# Patient Record
Sex: Female | Born: 1985 | Race: White | Hispanic: No | Marital: Single | Smoking: Former smoker
Health system: Southern US, Community
[De-identification: ages and names within clinical notes are randomized; demographics above are authoritative.]

## PROBLEM LIST (undated history)

## (undated) DIAGNOSIS — J189 Pneumonia, unspecified organism: Secondary | ICD-10-CM

## (undated) DIAGNOSIS — E119 Type 2 diabetes mellitus without complications: Secondary | ICD-10-CM

## (undated) DIAGNOSIS — F419 Anxiety disorder, unspecified: Secondary | ICD-10-CM

## (undated) DIAGNOSIS — F32A Depression, unspecified: Secondary | ICD-10-CM

## (undated) DIAGNOSIS — F329 Major depressive disorder, single episode, unspecified: Secondary | ICD-10-CM

## (undated) HISTORY — PX: TYMPANOSTOMY TUBE PLACEMENT: SHX32

## (undated) HISTORY — DX: Type 2 diabetes mellitus without complications: E11.9

## (undated) HISTORY — PX: RETINAL LASER PROCEDURE: SHX2339

## (undated) HISTORY — PX: MULTIPLE TOOTH EXTRACTIONS: SHX2053

---
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
## 2007-09-08 ENCOUNTER — Encounter: Payer: Self-pay | Admitting: Infectious Diseases

## 2007-12-15 ENCOUNTER — Ambulatory Visit: Payer: Self-pay | Admitting: Infectious Diseases

## 2007-12-15 DIAGNOSIS — T678XXA Other effects of heat and light, initial encounter: Secondary | ICD-10-CM | POA: Insufficient documentation

## 2007-12-15 DIAGNOSIS — R300 Dysuria: Secondary | ICD-10-CM | POA: Insufficient documentation

## 2007-12-15 LAB — CONVERTED CEMR LAB
ALT: 11 U/L
AST: 10 U/L
Albumin: 3.8 g/dL
Alkaline Phosphatase: 66 U/L
BUN: 16 mg/dL
Basophils Absolute: 0 10*3/uL
Basophils Relative: 0 %
Bilirubin Urine: NEGATIVE
CO2: 24 meq/L
Calcium: 9.8 mg/dL
Chlamydia, Swab/Urine, PCR: NEGATIVE
Chloride: 100 meq/L
Creatinine, Ser: 1.11 mg/dL
Eosinophils Absolute: 0.2 10*3/uL
Eosinophils Relative: 2 %
GC Probe Amp, Urine: NEGATIVE
Glucose, Bld: 300 mg/dL — ABNORMAL HIGH
HCT: 42.8 %
Hemoglobin, Urine: NEGATIVE
Hemoglobin: 14.9 g/dL
Ketones, ur: NEGATIVE mg/dL
Leukocytes, UA: NEGATIVE
Lymphocytes Relative: 34 %
Lymphs Abs: 3.1 10*3/uL
MCHC: 34.8 g/dL
MCV: 83.1 fL
Monocytes Absolute: 0.6 10*3/uL
Monocytes Relative: 6 %
Neutro Abs: 5.4 10*3/uL
Neutrophils Relative %: 58 %
Nitrite: POSITIVE — AB
Platelets: 328 10*3/uL
Potassium: 4.5 meq/L
Protein, ur: NEGATIVE mg/dL
RBC: 5.15 M/uL — ABNORMAL HIGH
RDW: 12.7 %
Sed Rate: 25 mm/h — ABNORMAL HIGH
Sodium: 137 meq/L
Specific Gravity, Urine: 1.031 — ABNORMAL HIGH
TSH: 1.4 u[IU]/mL
Total Bilirubin: 0.2 mg/dL — ABNORMAL LOW
Total Protein: 7.1 g/dL
Urine Glucose: >1000 mg/dL — AB
Urobilinogen, UA: 0.2
WBC: 9.4 10*3/uL
pH: 5.5

## 2009-07-08 ENCOUNTER — Emergency Department (HOSPITAL_COMMUNITY): Admission: EM | Admit: 2009-07-08 | Discharge: 2009-07-08 | Payer: Self-pay | Admitting: Emergency Medicine

## 2010-11-29 NOTE — Op Note (Signed)
[place]  Patient:    Kristen Carrillo, Kristen Carrillo                     MRN: [id number] Proc. Date: 09/04/99 Adm. Date:  [id number] Disc. Date: [id number] Attending:  Retia Passe                           Operative Report  PREOPERATIVE DIAGNOSES: 1. Malformed and abscessed teeth #2, #15, #18, and #31. 2. History of insulin-dependent diabetes mellitus.  POSTOPERATIVE DIAGNOSES: 1. Malformed and abscessed teeth #2, #15, #18, and #31. 2. History of insulin-dependent diabetes mellitus.  SURGERY PERFORMED:  Removal of the above teeth.  SURGEON:  Vania Rea. Warren Danes, D.D.S.  ANESTHESIA:  General via oral endotracheal intubation.  ESTIMATED BLOOD LOSS:  Less than 50 cc.  FLUID REPLACEMENT:  Approximately 1000 cc crystalloid solutions.  COMPLICATIONS:  None apparent.  INDICATIONS FOR PROCEDURE:  [name] is a 25 year old female who was referred o my office for removal of the above teeth.  The patient has an abscessed tooth #31, and teeth #2, #15, and #18, are severely malformed and hypoplastic.  Due to her  history of insulin-dependent diabetes the procedure was scheduled under operating room conditions where her insulin and glucose could be monitored closely.  PROCEDURE IN DETAIL:  On September 04, 1999, [name] was taken to [facility] where she was placed on the operating room table in a supine position.  Following successful oral endotracheal intubation and general anesthesia, the patients face, neck, and oral cavity were prepped in usual sterile operating room fashion.  A 2 inch vaginal pack was placed as a throat pack.  Attention was then directed intraorally, where approximately 10 cc of 0.50% Xylocaine containing 1:200,000 epinephrine were infiltrated in the right left posterior alveolar nerve distributions of the maxilla and the right and left inferior alveolar neurovascular regions.  Attention was then  directed towards the maxillary arch, where a full thickness mucoperiosteal flap was elevated around he buccal and lingual surfaces of teeth #2, and #15.  The teeth were subluxated from the alveolus using an 11A elevator, removed from the oral cavity using a 150 dental forceps.  The areas were smoothed with a small osseous file and thoroughly irrigated and suctioned.  The mucoperiosteal margins were approximated and sutured in an interrupted fashion using 4-0 chromic suture material on FS3 needle.  Next, teeth #18, and #31 were removed with tooth #31 having a large periapical abscess that was removed as well.  The margins were smoothed and contoured with the osseous file and thoroughly irrigated and suctioned.  Mucoperiosteal margins were approximated and sutured following placement of Gelfoam/Maxitrol packs in the lower extraction site.  The patient was allowed to awaken from the anesthesia, and was taken to the recovery room following removal of the throat pack, which she tolerated the procedure well and without apparent complication. DD:  09/04/99 TD:  09/04/99 Job: [id number]

## 2010-12-13 LAB — HM DIABETES EYE EXAM

## 2011-05-16 ENCOUNTER — Emergency Department (HOSPITAL_COMMUNITY)
Admission: EM | Admit: 2011-05-16 | Discharge: 2011-05-16 | Disposition: A | Discharge status: Home / Self Care | Payer: BC Managed Care – PPO | Attending: Emergency Medicine | Admitting: Emergency Medicine

## 2011-05-16 DIAGNOSIS — R112 Nausea with vomiting, unspecified: Secondary | ICD-10-CM | POA: Insufficient documentation

## 2011-05-16 DIAGNOSIS — E109 Type 1 diabetes mellitus without complications: Secondary | ICD-10-CM | POA: Insufficient documentation

## 2011-05-16 DIAGNOSIS — Z794 Long term (current) use of insulin: Secondary | ICD-10-CM | POA: Insufficient documentation

## 2011-05-16 DIAGNOSIS — R197 Diarrhea, unspecified: Secondary | ICD-10-CM | POA: Insufficient documentation

## 2011-05-16 LAB — URINALYSIS, ROUTINE W REFLEX MICROSCOPIC
Bilirubin Urine: NEGATIVE
Glucose, UA: NEGATIVE mg/dL
Hgb urine dipstick: NEGATIVE
Ketones, ur: >80 mg/dL — AB
Leukocytes, UA: NEGATIVE
Nitrite: NEGATIVE
Protein, ur: NEGATIVE mg/dL
Specific Gravity, Urine: 1.016 (ref 1.005–1.030)
Urobilinogen, UA: 1 mg/dL (ref 0.0–1.0)
pH: 7 (ref 5.0–8.0)

## 2011-05-16 LAB — DIFFERENTIAL
Basophils Absolute: 0 10*3/uL (ref 0.0–0.1)
Basophils Relative: 0 % (ref 0–1)
Eosinophils Absolute: 0 10*3/uL (ref 0.0–0.7)
Eosinophils Relative: 0 % (ref 0–5)
Lymphocytes Relative: 15 % (ref 12–46)
Lymphs Abs: 2 10*3/uL (ref 0.7–4.0)
Monocytes Absolute: 0.5 10*3/uL (ref 0.1–1.0)
Monocytes Relative: 4 % (ref 3–12)
Neutro Abs: 10.4 10*3/uL — ABNORMAL HIGH (ref 1.7–7.7)
Neutrophils Relative %: 81 % — ABNORMAL HIGH (ref 43–77)

## 2011-05-16 LAB — BASIC METABOLIC PANEL
BUN: 11 mg/dL (ref 6–23)
CO2: 23 mEq/L (ref 19–32)
CO2: 20 mEq/L (ref 19–32)
Calcium: 8.5 mg/dL (ref 8.4–10.5)
Chloride: 96 mEq/L (ref 96–112)
Chloride: 104 mEq/L (ref 96–112)
Creatinine, Ser: 0.54 mg/dL (ref 0.50–1.10)
Glucose, Bld: 222 mg/dL — ABNORMAL HIGH (ref 70–99)
Glucose, Bld: 200 mg/dL — ABNORMAL HIGH (ref 70–99)
Potassium: 3.9 mEq/L (ref 3.5–5.1)
Potassium: 4.2 mEq/L (ref 3.5–5.1)
Sodium: 136 mEq/L (ref 135–145)

## 2011-05-16 LAB — GLUCOSE, CAPILLARY
Glucose-Capillary: 190 mg/dL — ABNORMAL HIGH (ref 70–99)
Glucose-Capillary: 160 mg/dL — ABNORMAL HIGH (ref 70–99)

## 2011-05-16 LAB — CBC
HCT: 40.6 % (ref 36.0–46.0)
Hemoglobin: 14.6 g/dL (ref 12.0–15.0)
MCV: 82.5 fL (ref 78.0–100.0)
RBC: 4.92 MIL/uL (ref 3.87–5.11)
RDW: 12.4 % (ref 11.5–15.5)
WBC: 12.9 10*3/uL — ABNORMAL HIGH (ref 4.0–10.5)

## 2013-01-10 LAB — HM DIABETES EYE EXAM

## 2014-08-08 ENCOUNTER — Ambulatory Visit (INDEPENDENT_AMBULATORY_CARE_PROVIDER_SITE_OTHER): Payer: Self-pay | Admitting: Internal Medicine

## 2014-08-08 ENCOUNTER — Encounter: Payer: Self-pay | Admitting: Internal Medicine

## 2014-08-08 VITALS — BP 112/64 | HR 114 | Temp 98.5°F | Resp 12 | Ht 67.0 in | Wt 158.0 lb

## 2014-08-08 DIAGNOSIS — E109 Type 1 diabetes mellitus without complications: Secondary | ICD-10-CM

## 2014-08-08 LAB — HEMOGLOBIN A1C: HEMOGLOBIN A1C: 6.4 % (ref 4.6–6.5)

## 2014-08-08 NOTE — Patient Instructions (Signed)
Please change the pump settings as shown in bold: - basal rates: 12 am: 1.6 units/h >> 1.3 2 am: 1.1 >> 0.9 5 am: 2.0 >> 1.9 8 am: 1.7 >> 1.5 3 pm: 1.9 >> 1.8 8 pm: 1.9 >> 1.8 - ICR: 8.5 - target: 100-100 >> 110-110 - ISF: 30  - Insulin on Board: 4h - bolus wizard: on  Try to eat 3 meals a day, whenever possible.  Start the boluses 15 minutes before you eat.  Please stop at the lab.  Please return in 1.5 month with your sugar log.   Basic Rules for Patients with Type I Diabetes Mellitus  1. The American Diabetes Association (ADA) recommended targets: - fasting sugar <130 - after meal sugar <180 - HbA1C <7%  2. Engage in ?150 min moderate exercise per week  3. Make sure you have ?8h of sleep every night as this helps both blood sugars and your weight.  4. Always keep a sugar log (not only record in your meter) and bring it to all appointments with [place].  5. If you are on a pump, know how to access the settings and to modify the parameters.  6.  Remember, you can always call the number on the back of the pump for emergencies related to the pump.  7. "15-15 rule" for hypoglycemia: if sugars are low, take 15 g of carbs** ("fast sugar" - e.g. 4 glucose tablets, 4 oz orange juice), wait 15 min, then check sugars again. If still <80, repeat. Continue  until your sugars >80, then eat a normal meal.   8. Teach family members and coworkers to inject glucagon. Have a glucagon set at home and one at work. They should call 911 after using the set.  9. If you are on a pump, set "insulin on board" time for 5 hours (if your sugars tend to be higher, can use 4 hours).   10. If you are on a pump, use the "dual wave bolus" setting for high fat foods (e.g. pizza). Start with a setting of 50%-50% (50% instant bolus and 50% prolonged bolus over 3h, for e.g.).    11. If you are on a pump, make sure the basal daily insulin dose is approximately equal (not larger) to the daily insulin you get  from boluses, otherwise you are at risk for hypoglycemia.  12. Check sugar before driving. If <100, correct, and only start driving if sugars rise ?161. Check sugar every hour when on a long drive.  13. Check sugar before exercising. If <100, correct, and only start exercising if sugars rise ?100. Check sugar every hour when on a long exercise routine and 1h after you finished exercising.   If >250, check urine for ketones. If you have moderate-large ketones in urine, do not start exercise. Hydrate yourself with clear liquids and correct the high sugar. Recheck sugars and ketones before attempting to exercise.  Be aware that you might need less insulin when exercising.  *intense, short, exercise bursts can increase your sugars, but  *less intense, longer (>1h), exercise routines can decrease your sugars.  If you are on a pump, you might need to decrease your basal rate by 10% or more (or even disconnect your pump) while you exercise to prevent low sugars. Do not disconnect your pump by more than 3 hours at a time! You also might need to decrease your insulin bolus for the meal prior to your exercise time by 20% or more.  14. Make sure  you have a MedAlert bracelet or pendant mentioning "Type I Diabetes Mellitus". If you have a prior episode of severe hypoglycemia or hypoglycemia unawareness, it should also mention this.  15. Please do not walk barefoot. Inspect your feet for sores/cuts and let us know if you have them.  16. Please call  Endocrinology with any questions and concerns [phone]).   **E.g. of "fast carbs": ? first choice (15 g):  1 tube glucose gel, GlucoPouch 15, 2 oz glucose liquid ? second choice (15-16 g):  3 or 4 glucose tablets (best taken  with water), 15 Dextrose Bits chewable ? third choice (15-20 g):   cup fruit juice,  cup regular soda, 1 cup skim milk,  1 cup sports drink ? fourth choice (15-20 g):  1 small tube [place] gel (not frosting), 2  tbsp raisins, 1 tbsp table sugar,  candy, jelly beans, gum drops - check package for carb amount   (adapted from: Juluis RainierMcCall A.L. "Insulin therapy and hypoglycemia" Endocrinol Metab Clin N Am 2012, 41: 57-87)
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
## 2014-09-19 ENCOUNTER — Encounter: Payer: Self-pay | Admitting: Internal Medicine

## 2014-09-19 ENCOUNTER — Ambulatory Visit (INDEPENDENT_AMBULATORY_CARE_PROVIDER_SITE_OTHER): Payer: BLUE CROSS/BLUE SHIELD | Admitting: Internal Medicine

## 2014-09-19 VITALS — BP 118/78 | HR 86 | Temp 98.3°F | Resp 12 | Wt 157.0 lb

## 2014-09-19 DIAGNOSIS — E10649 Type 1 diabetes mellitus with hypoglycemia without coma: Secondary | ICD-10-CM | POA: Insufficient documentation

## 2014-09-19 NOTE — Patient Instructions (Signed)
Please change the pump settings as follows: - basal rates: 12 am: 1.3 units/h >> 1.1 2 am: 0.9 >> 1.0 5 am: 1.9 >> 1.6 8 am: 1.5 3 pm: 1.8 >> 1.6 8 pm: 1.8 >> 1.6 - ICR: 8.5 - target: 110-110 - ISF: 30 >> 40 - Insulin on Board: 4h - bolus wizard: on  Please return in 3 months with your sugar log.
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
## 2014-10-11 LAB — HM PAP SMEAR

## 2014-11-29 LAB — HM DIABETES EYE EXAM

## 2014-12-22 ENCOUNTER — Ambulatory Visit (INDEPENDENT_AMBULATORY_CARE_PROVIDER_SITE_OTHER): Payer: BC Managed Care – PPO | Admitting: Internal Medicine

## 2014-12-22 VITALS — BP 112/64 | HR 108 | Temp 98.6°F | Resp 12 | Wt 162.0 lb

## 2014-12-22 DIAGNOSIS — E10649 Type 1 diabetes mellitus with hypoglycemia without coma: Secondary | ICD-10-CM

## 2014-12-22 LAB — COMPLETE METABOLIC PANEL WITH GFR
ALT: 13 U/L (ref 0–35)
AST: 15 U/L (ref 0–37)
Albumin: 3.4 g/dL — ABNORMAL LOW (ref 3.5–5.2)
Alkaline Phosphatase: 41 U/L (ref 39–117)
BILIRUBIN TOTAL: 0.4 mg/dL (ref 0.2–1.2)
BUN: 14 mg/dL (ref 6–23)
CALCIUM: 9.2 mg/dL (ref 8.4–10.5)
CO2: 23 meq/L (ref 19–32)
Chloride: 103 mEq/L (ref 96–112)
Creat: 0.77 mg/dL (ref 0.50–1.10)
GFR, Est African American: >89 mL/min
GFR, Est Non African American: >89 mL/min
Glucose, Bld: 108 mg/dL — ABNORMAL HIGH (ref 70–99)
Potassium: 4.4 mEq/L (ref 3.5–5.3)
SODIUM: 142 meq/L (ref 135–145)
TOTAL PROTEIN: 5.9 g/dL — AB (ref 6.0–8.3)

## 2014-12-22 LAB — LIPID PANEL
CHOL/HDL RATIO: 2
Cholesterol: 167 mg/dL (ref 0–200)
HDL: 70.8 mg/dL (ref >39.00)
LDL CALC: 84 mg/dL (ref 0–99)
NonHDL: 96.2
TRIGLYCERIDES: 59 mg/dL (ref 0.0–149.0)
VLDL: 11.8 mg/dL (ref 0.0–40.0)

## 2014-12-22 LAB — MICROALBUMIN / CREATININE URINE RATIO
Creatinine,U: 152 mg/dL
Microalb Creat Ratio: 0.5 mg/g (ref 0.0–30.0)
Microalb, Ur: <0.7 mg/dL (ref 0.0–1.9)

## 2014-12-22 LAB — TSH: TSH: 1.14 u[IU]/mL (ref 0.35–4.50)

## 2014-12-22 LAB — HEMOGLOBIN A1C: Hgb A1c MFr Bld: 6.6 % — ABNORMAL HIGH (ref 4.6–6.5)

## 2014-12-22 MED ORDER — NOVOLOG 100 UNIT/ML ~~LOC~~ SOLN: SUBCUTANEOUS | Status: DC
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
## 2014-12-22 NOTE — Patient Instructions (Signed)
Please change the pump settings as follows: - basal rates: 12 am: 1.1 units/h >> 1.3 2 am: 1.0 >> 1.3 5 am: 1.6 >> 1.75 8 am: 1.5 3 pm: 1.6 >> 1.5 8 pm: 1.6 - ICR: 8.5, except 5-8 pm: 7.5 - target: 110-110 - ISF: 40 - Insulin on Board: 4h - bolus wizard: on  Please return in 3 months.  Please stop at the lab.

## 2014-12-23 ENCOUNTER — Encounter: Payer: Self-pay | Admitting: Internal Medicine

## 2014-12-26 ENCOUNTER — Encounter: Payer: Self-pay | Admitting: *Deleted

## 2015-01-17 ENCOUNTER — Telehealth: Payer: Self-pay | Admitting: Nutrition

## 2015-01-17 NOTE — Telephone Encounter (Signed)
Message left on her machine that she did not need to fill the cartridge on her insulin pump full.  She was told how to look on her pump to know how much insulin she uses each day and then multiply that by 3, and then  to add 50 more units to that total.  This will give her enough insulin, in case she gets sick or eats more.  She was told to call me if she had questions on how to do this.  I gave her my number.

## 2015-03-26 ENCOUNTER — Ambulatory Visit: Payer: BLUE CROSS/BLUE SHIELD | Admitting: Internal Medicine

## 2015-04-13 ENCOUNTER — Ambulatory Visit: Payer: BC Managed Care – PPO | Admitting: Internal Medicine

## 2015-06-05 ENCOUNTER — Other Ambulatory Visit (INDEPENDENT_AMBULATORY_CARE_PROVIDER_SITE_OTHER): Payer: BC Managed Care – PPO | Admitting: *Deleted

## 2015-06-05 ENCOUNTER — Ambulatory Visit (INDEPENDENT_AMBULATORY_CARE_PROVIDER_SITE_OTHER): Payer: BC Managed Care – PPO | Admitting: Internal Medicine

## 2015-06-05 ENCOUNTER — Encounter: Payer: Self-pay | Admitting: Internal Medicine

## 2015-06-05 VITALS — BP 118/60 | HR 91 | Temp 98.2°F | Resp 12 | Wt 158.6 lb

## 2015-06-05 DIAGNOSIS — E10649 Type 1 diabetes mellitus with hypoglycemia without coma: Secondary | ICD-10-CM | POA: Diagnosis not present

## 2015-06-05 LAB — POCT GLYCOSYLATED HEMOGLOBIN (HGB A1C): Hemoglobin A1C: 6.9

## 2015-06-05 NOTE — Patient Instructions (Addendum)
Please change the pump settings as follows: - basal rates: 12 am: 1.3 units/h  2 am: 1.3 5 am: 1.75 >> 1.8 8 am: 1.5 >> 1.55 11 am: 1.5 8 pm: 1.5 - ICR:  12 am: 8.5 11 am: 9.5 5 pm: 7.5 9 pm: 8.5 - target: 110-110 - ISF: 40 - Insulin on Board: 4h - bolus wizard: on  Please return in 3 months.
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
## 2015-09-06 ENCOUNTER — Ambulatory Visit: Payer: BC Managed Care – PPO | Admitting: Family Medicine

## 2015-09-10 LAB — HM DIABETES FOOT EXAM

## 2015-09-11 ENCOUNTER — Ambulatory Visit (INDEPENDENT_AMBULATORY_CARE_PROVIDER_SITE_OTHER): Payer: BC Managed Care – PPO | Admitting: Internal Medicine

## 2015-09-11 ENCOUNTER — Other Ambulatory Visit (INDEPENDENT_AMBULATORY_CARE_PROVIDER_SITE_OTHER): Payer: BC Managed Care – PPO | Admitting: *Deleted

## 2015-09-11 ENCOUNTER — Encounter: Payer: Self-pay | Admitting: Internal Medicine

## 2015-09-11 VITALS — BP 110/66 | HR 92 | Temp 97.4°F | Resp 12 | Wt 169.0 lb

## 2015-09-11 DIAGNOSIS — E10649 Type 1 diabetes mellitus with hypoglycemia without coma: Secondary | ICD-10-CM | POA: Diagnosis not present

## 2015-09-11 LAB — POCT GLYCOSYLATED HEMOGLOBIN (HGB A1C): HEMOGLOBIN A1C: 7.2

## 2015-09-11 NOTE — Patient Instructions (Addendum)
Please change the pump settings as follows: - basal rates: 12 am: 1.3 >> 1.4 units/h  2 am: 1.3 >> 1.4 5 am: 1.8 8 am: 1.55 11 am: 1.5 8 pm: 1.5 - ICR:  12 am: 8.5 11 am: 9.5 >> 9.0 5 pm: 7.5 9 pm: 8.5 >> 8.0 - target: 110-110 - ISF: 40 >> 50 - Insulin on Board: 4h - bolus wizard: on  Please return in 3 months, but please schedule an appt with Bonita Quin in 3 weeks.
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
## 2015-09-26 ENCOUNTER — Other Ambulatory Visit: Payer: Self-pay | Admitting: Anesthesiology

## 2015-09-26 ENCOUNTER — Ambulatory Visit
Admission: RE | Admit: 2015-09-26 | Discharge: 2015-09-26 | Disposition: A | Discharge status: Home / Self Care | Payer: BC Managed Care – PPO | Source: Ambulatory Visit | Attending: Anesthesiology | Admitting: Anesthesiology

## 2015-09-26 DIAGNOSIS — M542 Cervicalgia: Secondary | ICD-10-CM

## 2015-10-09 LAB — POCT GLUCOSE (DEVICE FOR HOME USE)

## 2015-10-10 ENCOUNTER — Encounter: Payer: BC Managed Care – PPO | Attending: Internal Medicine | Admitting: Nutrition

## 2015-10-11 ENCOUNTER — Ambulatory Visit (INDEPENDENT_AMBULATORY_CARE_PROVIDER_SITE_OTHER): Payer: BC Managed Care – PPO | Admitting: Family Medicine

## 2015-10-11 ENCOUNTER — Encounter: Payer: Self-pay | Admitting: Family Medicine

## 2015-10-11 VITALS — BP 111/68 | HR 86 | Temp 98.1°F | Resp 16 | Ht 67.0 in | Wt 168.1 lb

## 2015-10-11 DIAGNOSIS — F902 Attention-deficit hyperactivity disorder, combined type: Secondary | ICD-10-CM

## 2015-10-11 DIAGNOSIS — F429 Obsessive-compulsive disorder, unspecified: Secondary | ICD-10-CM

## 2015-10-11 DIAGNOSIS — E10649 Type 1 diabetes mellitus with hypoglycemia without coma: Secondary | ICD-10-CM | POA: Diagnosis not present

## 2015-10-11 DIAGNOSIS — R5383 Other fatigue: Secondary | ICD-10-CM | POA: Diagnosis not present

## 2015-10-11 LAB — CBC WITH DIFFERENTIAL/PLATELET
BASOS PCT: 1 % (ref 0–1)
Basophils Absolute: 0.1 10*3/uL (ref 0.0–0.1)
EOS PCT: 3 % (ref 0–5)
Eosinophils Absolute: 0.2 10*3/uL (ref 0.0–0.7)
HEMATOCRIT: 41.2 % (ref 36.0–46.0)
Hemoglobin: 14.2 g/dL (ref 12.0–15.0)
Lymphocytes Relative: 44 % (ref 12–46)
Lymphs Abs: 3.2 10*3/uL (ref 0.7–4.0)
MCH: 28.7 pg (ref 26.0–34.0)
MCHC: 34.5 g/dL (ref 30.0–36.0)
MCV: 83.2 fL (ref 78.0–100.0)
MONO ABS: 0.5 10*3/uL (ref 0.1–1.0)
MPV: 9 fL (ref 8.6–12.4)
Monocytes Relative: 7 % (ref 3–12)
Neutro Abs: 3.3 10*3/uL (ref 1.7–7.7)
Neutrophils Relative %: 45 % (ref 43–77)
Platelets: 282 10*3/uL (ref 150–400)
RBC: 4.95 MIL/uL (ref 3.87–5.11)
RDW: 13.1 % (ref 11.5–15.5)
WBC: 7.3 10*3/uL (ref 4.0–10.5)

## 2015-10-11 LAB — BASIC METABOLIC PANEL
BUN: 19 mg/dL (ref 7–25)
CO2: 25 mmol/L (ref 20–31)
CREATININE: 0.75 mg/dL (ref 0.50–1.10)
Calcium: 9.2 mg/dL (ref 8.6–10.2)
Chloride: 102 mmol/L (ref 98–110)
Glucose, Bld: 193 mg/dL — ABNORMAL HIGH (ref 65–99)
Potassium: 4.2 mmol/L (ref 3.5–5.3)
Sodium: 137 mmol/L (ref 135–146)

## 2015-10-11 LAB — TSH: TSH: 1.92 mIU/L

## 2015-10-11 NOTE — Patient Instructions (Signed)
Schedule your complete physical in 6 months We'll notify you of your lab results and make any changes if needed Keep up the good work!  You look great! Call with any questions or concerns Welcome!  We're glad to have you!!! 

## 2015-10-11 NOTE — Progress Notes (Signed)
Pre visit review using our clinic review tool, if applicable. No additional management support is needed unless otherwise documented below in the visit note. 
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
## 2015-10-14 DIAGNOSIS — F429 Obsessive-compulsive disorder, unspecified: Secondary | ICD-10-CM | POA: Insufficient documentation

## 2015-10-14 DIAGNOSIS — F909 Attention-deficit hyperactivity disorder, unspecified type: Secondary | ICD-10-CM | POA: Insufficient documentation

## 2015-10-14 NOTE — Assessment & Plan Note (Signed)
New.  May be related to pt's current increase in stress and underlying depression but given her DM, will check labs to r/o underlying metabolic cause of fatigue.  Encouraged healthy diet, regular exercise, increased rest, and stress management.  Will follow.

## 2015-10-14 NOTE — Assessment & Plan Note (Signed)
New to provider, ongoing for pt.  sxs are well controlled on current medication as provided through [facility].

## 2015-10-14 NOTE — Assessment & Plan Note (Signed)
New to provider, ongoing for pt.  Using a pump and following w/ Dr Elvera LennoxGherghe.  UTD on eye exam.  Will follow along and assist as able.

## 2015-10-14 NOTE — Assessment & Plan Note (Signed)
New to provider, ongoing for pt.  Seeing Misty StanleyLisa Poulus as [facility].  Feels sxs are currently well controlled.  Will follow along.

## 2015-11-14 ENCOUNTER — Telehealth: Payer: Self-pay | Admitting: General Practice

## 2015-11-14 NOTE — Telephone Encounter (Signed)
Patient Name: Kristen Carrillo Gender: Female DOB: February 01, 1986 Age: 30 Y 8 M 12 D Return Phone Number: [phone] (Primary) Address: City/State/Zip: [place] Client [facility] Night - C Client Site [facility] - Night Physician Lezlie Octaveabori, Kate - MD Contact Type Call Who Is Calling Patient / Member / Family / Caregiver Call Type Triage / Clinical Relationship To Patient Self Return Phone Number [phone] (Primary) Chief Complaint Vomiting Reason for Call Symptomatic / Request for Health Information Initial Comment Caller states is vomiting PreDisposition Go to ED Translation No Nurse Assessment Nurse: Kizzie BaneHughes, RN, Zella Ballobin Date/Time (Eastern Time): 11/14/2015 3:01:10 AM Confirm and document reason for call. If symptomatic, describe symptoms. You must click the next button to save text entered. ---Caller states she is vomiting with no diarrhea. Patient states her temp. is 98.0 orally. Vomiting started around 11:00 p.m. Has the patient traveled out of the country within the last 30 days? ---No Does the patient have any new or worsening symptoms? ---Yes Will a triage be completed? ---Yes Related visit to physician within the last 2 weeks? ---No Does the PT have any chronic conditions? (i.e. diabetes, asthma, etc.) ---Yes List chronic conditions. ---Type I diabetic, depression Is the patient pregnant or possibly pregnant? (Ask all females between the ages of 5412-55) ---No Is this a behavioral health or substance abuse call? ---No Guidelines Guideline Title Affirmed Question Affirmed Notes Nurse Date/Time (Eastern Time) Vomiting MILD or MODERATE vomiting (e.g., 1 - 5 times / day) (all triage questions negative) Kizzie BaneHughes, RN, Zella Ballobin 11/14/2015 3:06:53 AM Disp. Time Lamount Cohen(Eastern Time) Disposition Final User PLEASE NOTE: All timestamps contained within this report are represented as [place] Standard Time. CONFIDENTIALTY NOTICE: This fax  transmission is intended only for the addressee. It contains information that is legally privileged, confidential or otherwise protected from use or disclosure. If you are not the intended recipient, you are strictly prohibited from reviewing, disclosing, copying using or disseminating any of this information or taking any action in reliance on or regarding this information. If you have received this fax in error, please notify us immediately by telephone so that we can arrange for its return to us. Phone: [phone], Toll-Free: [phone], Fax: [phone] Page: 2 of 2 Call Id: [id number] 11/14/2015 3:14:16 AM Home Care Yes Kizzie BaneHughes, RN, Carey Bullocksobin Caller Understands: Yes Disagree/Comply: Comply Care Advice Given Per Guideline HOME CARE: You should be able to treat this at home. REASSURANCE: Vomiting can be caused by many things. It is often caused by a stomach virus (stomach flu) or mild food poisoning. Staying well-hydrated is the most important thing. CLEAR LIQUIDS: Try to sip small amounts (1 tablespoon or 15 ml) of liquid frequently (every 5 minutes) for 8 hours, rather than trying to drink a lot of liquid all at one time. * Sip water or a 1/2 stength sports drink (e.g., Gatorade or Powerade). * Other options: 1/2 strength flat lemon-lime soda or ginger ale. * After 4 hours without vomiting, increase the amount. SOLID FOOD: * You may begin eating bland foods after 8 hours without vomiting. * Start with saltine crackers, white bread, rice, mashed potatoes, cereal, applesauce, etc. * You can resume a normal diet in 24-48 hours. AVOID NON-ESSENTIAL MEDS: * Discontinue all vitamins and non-prescription medicines for 24 hours. (Reason: may make vomiting worse.) * Call if vomiting a prescription medicine. EXPECTED COURSE: Vomiting from viral gastritis (stomach flu) usually stops in 12 to 48 hours. If diarrhea is present, it usually continues for several days. CALL  BACK IF: * Vomiting lasts more than 2  days (48 hours) * Vomit contains bile (green color) * Diarrhea lasts more than 7 days * Contant stomach pain lasting more than 2 hours * Signs of dehydration (e.g., no urination over 12 hours, very lightheaded) CARE ADVICE per Vomiting (Adult) guideline.

## 2015-11-26 ENCOUNTER — Ambulatory Visit (INDEPENDENT_AMBULATORY_CARE_PROVIDER_SITE_OTHER): Payer: BC Managed Care – PPO | Admitting: Internal Medicine

## 2015-11-26 ENCOUNTER — Encounter: Payer: Self-pay | Admitting: Internal Medicine

## 2015-11-26 VITALS — BP 100/62 | HR 82 | Temp 98.4°F | Ht 67.0 in | Wt 173.4 lb

## 2015-11-26 DIAGNOSIS — E10649 Type 1 diabetes mellitus with hypoglycemia without coma: Secondary | ICD-10-CM

## 2015-11-26 NOTE — Patient Instructions (Signed)
Please change the pump settings: - basal rates: 12 am: 1.4 >> 1.5 units/h  2 am: 1.4 >> 1.5 5 am: 1.8 8 am: 1.55 11 am: 1.5 8 pm: 1.5 >> 1.6 - ICR:  12 am: 8.5 11 am: 9.0 5 pm: 7.5  9 pm: 8.0 >> 7.5 - target: 110-110 - ISF: 50 - Insulin on Board: 4h - bolus wizard: on  Please return in 3 months with your sugar log.   Please stop at the lab.
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
## 2015-11-27 ENCOUNTER — Encounter: Payer: Self-pay | Admitting: Internal Medicine

## 2015-11-27 LAB — LIPID PANEL
CHOL/HDL RATIO: 3
Cholesterol: 200 mg/dL (ref 0–200)
HDL: 74.4 mg/dL (ref >39.00)
LDL CALC: 114 mg/dL — AB (ref 0–99)
NONHDL: 125.19
TRIGLYCERIDES: 55 mg/dL (ref 0.0–149.0)
VLDL: 11 mg/dL (ref 0.0–40.0)

## 2015-11-27 LAB — MICROALBUMIN / CREATININE URINE RATIO
Creatinine,U: 198.8 mg/dL
MICROALB UR: 0.7 mg/dL (ref 0.0–1.9)
Microalb Creat Ratio: 0.4 mg/g (ref 0.0–30.0)

## 2015-11-27 LAB — HEMOGLOBIN A1C: Hgb A1c MFr Bld: 6.9 % — ABNORMAL HIGH (ref 4.6–6.5)

## 2016-02-18 LAB — HM DIABETES EYE EXAM

## 2016-02-26 ENCOUNTER — Ambulatory Visit (INDEPENDENT_AMBULATORY_CARE_PROVIDER_SITE_OTHER): Payer: BC Managed Care – PPO | Admitting: Internal Medicine

## 2016-02-26 ENCOUNTER — Encounter: Payer: Self-pay | Admitting: Internal Medicine

## 2016-02-26 VITALS — BP 104/70 | HR 94 | Ht 68.5 in | Wt 172.0 lb

## 2016-02-26 DIAGNOSIS — E10649 Type 1 diabetes mellitus with hypoglycemia without coma: Secondary | ICD-10-CM | POA: Diagnosis not present
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
## 2016-02-26 NOTE — Patient Instructions (Addendum)
Please change the settings as follows: - basal rates: 12 am: 1.5 units/h >> 1.45 2 am: 1.5 >> 1.45 5 am: 1.8 8 am: 1.55 11 am: 1.5 8 pm: 1.6 - ICR:  12 am: 8.5 11 am: 9.0 5 pm: 7.5 >> 4 pm: 7.5 9 pm: 7.5 - target: 110-110 - ISF: 50 - Insulin on Board: 4h - bolus wizard: on  Please return in 3 months.

## 2016-02-27 LAB — POCT GLYCOSYLATED HEMOGLOBIN (HGB A1C): HEMOGLOBIN A1C: 6.4

## 2016-02-27 NOTE — Addendum Note (Signed)
Addended by: Darene LamerHOMPSON, Bora Broner T on: 02/27/2016 08:06 AM   Modules accepted: Orders

## 2016-04-04 ENCOUNTER — Encounter (HOSPITAL_COMMUNITY): Payer: Self-pay | Admitting: *Deleted

## 2016-04-07 ENCOUNTER — Other Ambulatory Visit: Payer: Self-pay | Admitting: Obstetrics and Gynecology

## 2016-04-07 NOTE — H&P (Signed)
30 y.o. yo complains of excessive metrarhagia on OCPs and has possible uterine polyp. Pt has had persistent bleeding on OCPs and bad periods off. Pt weary of bleeding and has followed all instructions to try to correct. On previous US she had what appears to be Carrillo polyp inside EM. I explained to pt that this may be the cause of her excessive and irregular bleeding. D/w her that Carrillo D&C, hysteroscopy with removal of polyp may be able to correct this bleeding. Pt has Type 1 DM is on insulin pump and her sugars and A1C are all good; however, I believe it is best to do this surgery at the hospital because of her Type 1 DM. Checked CBC -normal. Scheduled D&C, hysteroscopy for removal of polyp.   Past Medical History:  Diagnosis Date  . Diabetes mellitus without complication (HCC)    DMT1  No past surgical history on file.  Social History   Social History  . Marital status: Single    Spouse name: N/Carrillo  . Number of children: N/Carrillo  . Years of education: N/Carrillo   Occupational History  . Not on file.   Social History Main Topics  . Smoking status: Former Smoker    Quit date: 07/14/2009  . Smokeless tobacco: Never Used  . Alcohol use 0.0 oz/week  . Drug use: No  . Sexual activity: Not on file   Other Topics Concern  . 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    Current Outpatient Prescriptions on File Prior to Encounter  Medication Sig Dispense Refill  . amphetamine-dextroamphetamine (ADDERALL) 20 MG tablet 2 tablets in the morning and 1 in the evening.  0  . celecoxib (CELEBREX) 200 MG capsule Take 200 mg by mouth daily.  1  . Fluvoxamine Maleate 150 MG CP24     . gabapentin (NEURONTIN) 300 MG capsule 2 tablets in the morning and 2 tablet in the evening    . HYDROcodone-acetaminophen (NORCO) 10-325 MG tablet     . hydrocortisone 2.5 % cream   3  . LORazepam (ATIVAN) 0.5 MG  tablet Take 0.5 mg by mouth daily as needed for anxiety.    . methocarbamol (ROBAXIN) 750 MG tablet Take 750 mg by mouth as needed.     Marland Kitchen. NOVOLOG 100 UNIT/ML injection For use in insulin pump - use up to 80 units 90 mL 3  . OCELLA 3-0.03 MG tablet Take 1 tablet by mouth daily.       Allergies  Allergen Reactions  . Colophony [Pinus Strobus] Hives and Rash  . Formaldehyde Hives and Rash    Like Carrillo burn  . Latex Rash  . Nickel Hives and Rash    @VITALS2 @  Lungs: clear to ascultation Cor:  RRR Abdomen:  soft, nontender, nondistended. Ex:  no cords, erythema Pelvic:  Vulva: no masses, no atrophy, no lesions (Perineal irritation and fissures) Vagina: no tenderness, no erythema, no abnormal vaginal discharge, no vesicle(s) or ulcers, no cystocele, no rectocele Cervix: no discharge, no cervical motion tenderness, friable, sample taken for Carrillo Pap smear (Yeast seen, GC/ct sent.) Uterus: normal size (8), normal shape, midline, no uterine prolapse, non-tender Bladder/Urethra: normal meatus, no urethral discharge, no urethral mass, bladder non distended, Urethra well supported Adnexa/Parametria: no parametrial tenderness, no parametrial mass, no adnexal tenderness, no ovarian mass   EM 1.13 cm  with echogenic focus c/w polyp-1.1 cm. LL fibroid 2.5 cm. RO normal, LO not seen.   Carrillo:  For d&c, hysteroscopy for metrarhagia and menorrhagia.   P: All risks, benefits and alternatives d/w patient and she desires to proceed.  Patient will have SCDs during the operation.     Kristen Carrillo

## 2016-04-09 NOTE — Patient Instructions (Addendum)
Your procedure is scheduled on:  Friday, Oct. 13, 2017  Enter through the Main Entrance of [facility] at:  6:00 AM  Pick up the phone at the desk and dial [phone].  Call this number if you have problems the morning of surgery: [phone].  Remember: Do NOT eat food or drink after:  Midnight Thursday, Oct. 12, 2017  Take these medicines the morning of surgery with a SIP OF WATER:  Celebrex, Gabapentin, Fluvoxamine, Lorazepam if needed  Continue insulin pump  Do NOT wear jewelry (body piercing), metal hair clips/bobby pins, make-up, or nail polish. Do NOT wear lotions, powders, or perfumes.  You may wear deodorant. Do NOT shave for 48 hours prior to surgery. Do NOT bring valuables to the hospital. Contacts, dentures, or bridgework may not be worn into surgery.  Have a responsible adult drive you home and stay with you for 24 hours after your procedure

## 2016-04-10 ENCOUNTER — Encounter (HOSPITAL_COMMUNITY)
Admission: RE | Admit: 2016-04-10 | Discharge: 2016-04-10 | Disposition: A | Discharge status: Home / Self Care | Payer: BC Managed Care – PPO | Source: Ambulatory Visit | Attending: Obstetrics and Gynecology | Admitting: Obstetrics and Gynecology

## 2016-04-10 ENCOUNTER — Encounter (HOSPITAL_COMMUNITY): Payer: Self-pay

## 2016-04-10 DIAGNOSIS — Z01812 Encounter for preprocedural laboratory examination: Secondary | ICD-10-CM | POA: Diagnosis present

## 2016-04-10 HISTORY — DX: Pneumonia, unspecified organism: J18.9

## 2016-04-10 HISTORY — DX: Depression, unspecified: F32.A

## 2016-04-10 HISTORY — DX: Anxiety disorder, unspecified: F41.9

## 2016-04-10 HISTORY — DX: Major depressive disorder, single episode, unspecified: F32.9

## 2016-04-10 LAB — CBC
HEMATOCRIT: 41.9 % (ref 36.0–46.0)
HEMOGLOBIN: 15.1 g/dL — AB (ref 12.0–15.0)
MCH: 29.5 pg (ref 26.0–34.0)
MCHC: 36 g/dL (ref 30.0–36.0)
MCV: 81.8 fL (ref 78.0–100.0)
Platelets: 341 10*3/uL (ref 150–400)
RBC: 5.12 MIL/uL — ABNORMAL HIGH (ref 3.87–5.11)
RDW: 12.6 % (ref 11.5–15.5)
WBC: 9.9 10*3/uL (ref 4.0–10.5)

## 2016-04-14 ENCOUNTER — Encounter: Payer: Self-pay | Admitting: Family Medicine

## 2016-04-14 ENCOUNTER — Ambulatory Visit (INDEPENDENT_AMBULATORY_CARE_PROVIDER_SITE_OTHER): Payer: BC Managed Care – PPO | Admitting: Family Medicine

## 2016-04-14 VITALS — BP 108/74 | HR 79 | Temp 98.7°F | Resp 16 | Ht 69.0 in | Wt 168.1 lb

## 2016-04-14 DIAGNOSIS — Z0000 Encounter for general adult medical examination without abnormal findings: Secondary | ICD-10-CM | POA: Diagnosis not present

## 2016-04-14 LAB — HEPATIC FUNCTION PANEL
ALK PHOS: 48 U/L (ref 39–117)
ALT: 11 U/L (ref 0–35)
AST: 13 U/L (ref 0–37)
Albumin: 3.4 g/dL — ABNORMAL LOW (ref 3.5–5.2)
BILIRUBIN DIRECT: 0.1 mg/dL (ref 0.0–0.3)
TOTAL PROTEIN: 6.6 g/dL (ref 6.0–8.3)
Total Bilirubin: 0.5 mg/dL (ref 0.2–1.2)

## 2016-04-14 LAB — CBC WITH DIFFERENTIAL/PLATELET
BASOS ABS: 0.1 10*3/uL (ref 0.0–0.1)
Basophils Relative: 2.3 % (ref 0.0–3.0)
EOS ABS: 0.3 10*3/uL (ref 0.0–0.7)
Eosinophils Relative: 4.4 % (ref 0.0–5.0)
HCT: 44.3 % (ref 36.0–46.0)
Hemoglobin: 15 g/dL (ref 12.0–15.0)
LYMPHS ABS: 2.5 10*3/uL (ref 0.7–4.0)
Lymphocytes Relative: 37.9 % (ref 12.0–46.0)
MCHC: 33.8 g/dL (ref 30.0–36.0)
MCV: 85.8 fl (ref 78.0–100.0)
MONOS PCT: 4.6 % (ref 3.0–12.0)
Monocytes Absolute: 0.3 10*3/uL (ref 0.1–1.0)
NEUTROS ABS: 3.3 10*3/uL (ref 1.4–7.7)
NEUTROS PCT: 50.8 % (ref 43.0–77.0)
PLATELETS: 329 10*3/uL (ref 150.0–400.0)
RBC: 5.16 Mil/uL — ABNORMAL HIGH (ref 3.87–5.11)
RDW: 12.5 % (ref 11.5–15.5)
WBC: 6.5 10*3/uL (ref 4.0–10.5)

## 2016-04-14 LAB — LIPID PANEL
CHOL/HDL RATIO: 3
CHOLESTEROL: 183 mg/dL (ref 0–200)
HDL: 64.1 mg/dL (ref >39.00)
LDL CALC: 109 mg/dL — AB (ref 0–99)
NonHDL: 118.5
TRIGLYCERIDES: 48 mg/dL (ref 0.0–149.0)
VLDL: 9.6 mg/dL (ref 0.0–40.0)

## 2016-04-14 LAB — BASIC METABOLIC PANEL
BUN: 14 mg/dL (ref 6–23)
CHLORIDE: 102 meq/L (ref 96–112)
CO2: 27 mEq/L (ref 19–32)
CREATININE: 0.7 mg/dL (ref 0.40–1.20)
Calcium: 8.9 mg/dL (ref 8.4–10.5)
GFR: 105.06 mL/min (ref >60.00)
GLUCOSE: 198 mg/dL — AB (ref 70–99)
POTASSIUM: 4.7 meq/L (ref 3.5–5.1)
Sodium: 137 mEq/L (ref 135–145)

## 2016-04-14 LAB — VITAMIN D 25 HYDROXY (VIT D DEFICIENCY, FRACTURES): VITD: 24.76 ng/mL — ABNORMAL LOW (ref 30.00–100.00)

## 2016-04-14 LAB — TSH: TSH: 1.6 u[IU]/mL (ref 0.35–4.50)

## 2016-04-14 NOTE — Progress Notes (Signed)
   Subjective:    Patient ID: Kristen Carrillo, female    DOB: [birth year], 30 y.o.   MRN: [id number]  HPI CPE- UTD on GYN.  Plans to get Flu shot at work.   Review of Systems Patient reports no vision/ hearing changes, adenopathy,fever, weight change,  persistant/recurrent hoarseness , swallowing issues, chest pain, palpitations, edema, persistant/recurrent cough, hemoptysis, dyspnea (rest/exertional/paroxysmal nocturnal), gastrointestinal bleeding (melena, rectal bleeding), abdominal pain, significant heartburn, bowel changes, GU symptoms (dysuria, hematuria, incontinence), Gyn symptoms (abnormal  bleeding, pain),  syncope, focal weakness, memory loss, numbness & tingling, skin/hair/nail changes, abnormal bruising or bleeding, anxiety, or depression.     Objective:   Physical Exam General Appearance:    Alert, cooperative, no distress, appears stated age  Head:    Normocephalic, without obvious abnormality, atraumatic  Eyes:    PERRL, conjunctiva/corneas clear, EOM's intact, fundi    benign, both eyes  Ears:    Normal TM's and external ear canals, both ears  Nose:   Nares normal, septum midline, mucosa normal, no drainage    or sinus tenderness  Throat:   Lips, mucosa, and tongue normal; teeth and gums normal  Neck:   Supple, symmetrical, trachea midline, no adenopathy;    Thyroid: no enlargement/tenderness/nodules  Back:     Symmetric, no curvature, ROM normal, no CVA tenderness  Lungs:     Clear to auscultation bilaterally, respirations unlabored  Chest Wall:    No tenderness or deformity   Heart:    Regular rate and rhythm, S1 and S2 normal, no murmur, rub   or gallop  Breast Exam:    Deferred to GYN  Abdomen:     Soft, non-tender, bowel sounds active all four quadrants,    no masses, no organomegaly  Genitalia:    Deferred to GYN  Rectal:    Extremities:   Extremities normal, atraumatic, no cyanosis or edema  Pulses:   2+ and symmetric all extremities  Skin:   Skin color,  texture, turgor normal, no rashes or lesions  Lymph nodes:   Cervical, supraclavicular, and axillary nodes normal  Neurologic:   CNII-XII intact, normal strength, sensation and reflexes    throughout          Assessment & Plan:  CPE- PE WNL.  UTD on GYN.  Check labs.  Anticipatory guidance provided.

## 2016-04-14 NOTE — Patient Instructions (Signed)
Follow up in 1 year or as needed We'll notify you of your lab results and make any changes if needed Keep up the good work!  You look great! Call with any questions or concerns Good Luck Next Week!!!

## 2016-04-14 NOTE — Progress Notes (Signed)
Pre visit review using our clinic review tool, if applicable. No additional management support is needed unless otherwise documented below in the visit note. 

## 2016-04-15 ENCOUNTER — Other Ambulatory Visit: Payer: Self-pay | Admitting: General Practice

## 2016-04-15 MED ORDER — VITAMIN D (ERGOCALCIFEROL) 1.25 MG (50000 UNIT) PO CAPS: 50000.0000 [IU] | ORAL_CAPSULE | ORAL | 0 refills | Status: DC

## 2016-04-25 ENCOUNTER — Encounter (HOSPITAL_COMMUNITY)
Admission: RE | Disposition: A | Discharge status: Home / Self Care | Payer: Self-pay | Source: Ambulatory Visit | Attending: Obstetrics and Gynecology

## 2016-04-25 ENCOUNTER — Encounter (HOSPITAL_COMMUNITY): Payer: Self-pay

## 2016-04-25 ENCOUNTER — Ambulatory Visit (HOSPITAL_COMMUNITY): Payer: BC Managed Care – PPO | Admitting: Anesthesiology

## 2016-04-25 ENCOUNTER — Ambulatory Visit (HOSPITAL_COMMUNITY)
Admission: RE | Admit: 2016-04-25 | Discharge: 2016-04-25 | Disposition: A | Discharge status: Home / Self Care | Payer: BC Managed Care – PPO | Source: Ambulatory Visit | Attending: Obstetrics and Gynecology | Admitting: Obstetrics and Gynecology

## 2016-04-25 DIAGNOSIS — Z9641 Presence of insulin pump (external) (internal): Secondary | ICD-10-CM | POA: Insufficient documentation

## 2016-04-25 DIAGNOSIS — N920 Excessive and frequent menstruation with regular cycle: Secondary | ICD-10-CM | POA: Insufficient documentation

## 2016-04-25 DIAGNOSIS — Z87891 Personal history of nicotine dependence: Secondary | ICD-10-CM | POA: Diagnosis not present

## 2016-04-25 DIAGNOSIS — E109 Type 1 diabetes mellitus without complications: Secondary | ICD-10-CM | POA: Diagnosis not present

## 2016-04-25 HISTORY — PX: HYSTEROSCOPY WITH D & C: SHX1775

## 2016-04-25 HISTORY — PX: CERVICAL POLYPECTOMY: SHX88

## 2016-04-25 LAB — GLUCOSE, CAPILLARY
GLUCOSE-CAPILLARY: 200 mg/dL — AB (ref 65–99)
Glucose-Capillary: 265 mg/dL — ABNORMAL HIGH (ref 65–99)

## 2016-04-25 LAB — PREGNANCY, URINE: PREG TEST UR: NEGATIVE

## 2016-04-25 SURGERY — DILATATION AND CURETTAGE /HYSTEROSCOPY
Anesthesia: General | Site: Vagina

## 2016-04-25 MED ORDER — SILVER NITRATE-POT NITRATE 75-25 % EX MISC
CUTANEOUS | Status: DC | PRN
  Administered 2016-04-25: 3 via TOPICAL

## 2016-04-25 MED ORDER — SCOPOLAMINE 1 MG/3DAYS TD PT72
MEDICATED_PATCH | TRANSDERMAL | Status: AC
  Administered 2016-04-25: 1.5 mg via TRANSDERMAL
  Filled 2016-04-25: qty 1

## 2016-04-25 MED ORDER — ONDANSETRON HCL 4 MG/2ML IJ SOLN: 4.0000 mg | Freq: Once | INTRAMUSCULAR | Status: DC | PRN

## 2016-04-25 MED ORDER — IBUPROFEN 100 MG/5ML PO SUSP
200.0000 mg | Freq: Four times a day (QID) | ORAL | Status: DC | PRN
  Filled 2016-04-25: qty 20

## 2016-04-25 MED ORDER — SCOPOLAMINE 1 MG/3DAYS TD PT72
1.0000 | MEDICATED_PATCH | Freq: Once | TRANSDERMAL | Status: DC
  Administered 2016-04-25: 1.5 mg via TRANSDERMAL

## 2016-04-25 MED ORDER — KETOROLAC TROMETHAMINE 30 MG/ML IJ SOLN
INTRAMUSCULAR | Status: AC
  Filled 2016-04-25: qty 1

## 2016-04-25 MED ORDER — KETOROLAC TROMETHAMINE 30 MG/ML IJ SOLN
INTRAMUSCULAR | Status: DC | PRN
  Administered 2016-04-25: 30 mg via INTRAVENOUS

## 2016-04-25 MED ORDER — MIDAZOLAM HCL 2 MG/2ML IJ SOLN
INTRAMUSCULAR | Status: DC | PRN
  Administered 2016-04-25: 2 mg via INTRAVENOUS

## 2016-04-25 MED ORDER — MEPERIDINE HCL 25 MG/ML IJ SOLN: 6.2500 mg | INTRAMUSCULAR | Status: DC | PRN

## 2016-04-25 MED ORDER — LIDOCAINE HCL (CARDIAC) 20 MG/ML IV SOLN
INTRAVENOUS | Status: DC | PRN
  Administered 2016-04-25: 70 mg via INTRAVENOUS
  Administered 2016-04-25: 30 mg via INTRAVENOUS

## 2016-04-25 MED ORDER — ONDANSETRON HCL 4 MG/2ML IJ SOLN
INTRAMUSCULAR | Status: AC
  Filled 2016-04-25: qty 2

## 2016-04-25 MED ORDER — HYDROCODONE-ACETAMINOPHEN 7.5-325 MG PO TABS: 1.0000 | ORAL_TABLET | Freq: Once | ORAL | Status: DC | PRN

## 2016-04-25 MED ORDER — FENTANYL CITRATE (PF) 100 MCG/2ML IJ SOLN: 25.0000 ug | INTRAMUSCULAR | Status: DC | PRN

## 2016-04-25 MED ORDER — METOCLOPRAMIDE HCL 5 MG/ML IJ SOLN
INTRAMUSCULAR | Status: DC | PRN
  Administered 2016-04-25: 10 mg via INTRAVENOUS

## 2016-04-25 MED ORDER — FENTANYL CITRATE (PF) 100 MCG/2ML IJ SOLN
INTRAMUSCULAR | Status: AC
  Filled 2016-04-25: qty 2

## 2016-04-25 MED ORDER — LIDOCAINE HCL (CARDIAC) 20 MG/ML IV SOLN
INTRAVENOUS | Status: AC
  Filled 2016-04-25: qty 5

## 2016-04-25 MED ORDER — LACTATED RINGERS IV SOLN
INTRAVENOUS | Status: DC
  Administered 2016-04-25: 125 mL/h via INTRAVENOUS
  Administered 2016-04-25: 08:00:00 via INTRAVENOUS

## 2016-04-25 MED ORDER — SODIUM CHLORIDE 0.9 % IR SOLN
Status: DC | PRN
  Administered 2016-04-25: 3000 mL

## 2016-04-25 MED ORDER — MIDAZOLAM HCL 2 MG/2ML IJ SOLN
INTRAMUSCULAR | Status: AC
  Filled 2016-04-25: qty 2

## 2016-04-25 MED ORDER — ONDANSETRON HCL 4 MG/2ML IJ SOLN
INTRAMUSCULAR | Status: DC | PRN
  Administered 2016-04-25: 4 mg via INTRAVENOUS

## 2016-04-25 MED ORDER — KETOROLAC TROMETHAMINE 30 MG/ML IJ SOLN: 30.0000 mg | Freq: Once | INTRAMUSCULAR | Status: DC

## 2016-04-25 MED ORDER — IBUPROFEN 200 MG PO TABS
200.0000 mg | ORAL_TABLET | Freq: Four times a day (QID) | ORAL | Status: DC | PRN
  Filled 2016-04-25: qty 2

## 2016-04-25 MED ORDER — FENTANYL CITRATE (PF) 100 MCG/2ML IJ SOLN
INTRAMUSCULAR | Status: DC | PRN
  Administered 2016-04-25: 25 ug via INTRAVENOUS
  Administered 2016-04-25: 50 ug via INTRAVENOUS
  Administered 2016-04-25: 25 ug via INTRAVENOUS

## 2016-04-25 MED ORDER — DEXAMETHASONE SODIUM PHOSPHATE 4 MG/ML IJ SOLN
INTRAMUSCULAR | Status: AC
  Filled 2016-04-25: qty 1

## 2016-04-25 MED ORDER — PROPOFOL 10 MG/ML IV BOLUS
INTRAVENOUS | Status: AC
  Filled 2016-04-25: qty 20

## 2016-04-25 MED ORDER — PROPOFOL 10 MG/ML IV BOLUS
INTRAVENOUS | Status: DC | PRN
  Administered 2016-04-25: 30 mg via INTRAVENOUS
  Administered 2016-04-25: 170 mg via INTRAVENOUS

## 2016-04-25 SURGICAL SUPPLY — 19 items
ABLATOR ENDOMETRIAL BIPOLAR (ABLATOR) IMPLANT
BIPOLAR CUTTING LOOP 21FR (ELECTRODE)
CANISTER SUCT 3000ML (MISCELLANEOUS) ×2 IMPLANT
CATH ROBINSON RED A/P 16FR (CATHETERS) ×2 IMPLANT
CLOTH BEACON ORANGE TIMEOUT ST (SAFETY) ×2 IMPLANT
CONTAINER PREFILL 10% NBF 60ML (FORM) ×4 IMPLANT
ELECT REM PT RETURN 9FT ADLT (ELECTROSURGICAL)
ELECTRODE REM PT RTRN 9FT ADLT (ELECTROSURGICAL) IMPLANT
GLOVE BIO SURGEON STRL SZ7 (GLOVE) ×2 IMPLANT
GLOVE BIOGEL PI IND STRL 7.0 (GLOVE) ×1 IMPLANT
GLOVE BIOGEL PI INDICATOR 7.0 (GLOVE) ×1
GOWN STRL REUS W/TWL LRG LVL3 (GOWN DISPOSABLE) ×4 IMPLANT
LOOP CUTTING BIPOLAR 21FR (ELECTRODE) IMPLANT
PACK VAGINAL MINOR WOMEN LF (CUSTOM PROCEDURE TRAY) ×2 IMPLANT
PAD OB MATERNITY 4.3X12.25 (PERSONAL CARE ITEMS) ×2 IMPLANT
TOWEL OR 17X24 6PK STRL BLUE (TOWEL DISPOSABLE) ×4 IMPLANT
TUBING AQUILEX INFLOW (TUBING) ×2 IMPLANT
TUBING AQUILEX OUTFLOW (TUBING) ×2 IMPLANT
WATER STERILE IRR 1000ML POUR (IV SOLUTION) ×2 IMPLANT

## 2016-04-25 NOTE — Anesthesia Postprocedure Evaluation (Signed)
Anesthesia Post Note  Patient: [name]  Procedure(s) Performed: Procedure(s) (LRB): DILATATION AND CURETTAGE /HYSTEROSCOPY (N/A) CERVICAL POLYPECTOMY (N/A)  Patient location during evaluation: PACU Anesthesia Type: General Level of consciousness: awake Pain management: pain level controlled Vital Signs Assessment: post-procedure vital signs reviewed and stable Respiratory status: spontaneous breathing Cardiovascular status: stable Postop Assessment: no signs of nausea or vomiting Anesthetic complications: no     Last Vitals:  Vitals:   04/25/16 0830 04/25/16 0845  BP: 102/76   Pulse: 63 77  Resp: 12 15  Temp:  36.9 C    Last Pain:  Vitals:   04/25/16 0845  TempSrc:   PainSc: 4    Pain Goal: Patients Stated Pain Goal: 5 (04/25/16 0845)               Anzel Kearse JR,JOHN Susann GivensFRANKLIN

## 2016-04-25 NOTE — Anesthesia Procedure Notes (Signed)
Procedure Name: LMA Insertion Date/Time: 04/25/2016 7:23 AM Performed by: Yolonda KidaARVER, Kristen Fatima L Pre-anesthesia Checklist: Patient identified, Emergency Drugs available, Suction available and Patient being monitored Patient Re-evaluated:Patient Re-evaluated prior to inductionOxygen Delivery Method: Circle system utilized Preoxygenation: Pre-oxygenation with 100% oxygen Intubation Type: IV induction Ventilation: Mask ventilation without difficulty LMA: LMA with gastric port inserted LMA Size: 4.0 Number of attempts: 1 Placement Confirmation: positive ETCO2,  breath sounds checked- equal and bilateral and CO2 detector Tube secured with: Tape Dental Injury: Teeth and Oropharynx as per pre-operative assessment

## 2016-04-25 NOTE — Brief Op Note (Signed)
04/25/2016  7:48 AM  PATIENT:  Kristen Carrillo  30 y.o. female  PRE-OPERATIVE DIAGNOSIS:  POLYP  POST-OPERATIVE DIAGNOSIS: normal endometrial cavity  PROCEDURE:  D&C, hysteroscopy  SURGEON:  Surgeon(s) and Role:    * Carrington ClampMichelle Reyah Streeter, MD - Primary   ANESTHESIA:   general  EBL:  Total I/O In: 1000 [I.V.:1000] Out: 160 [Urine:150; Blood:10]   SPECIMEN:  Source of Specimen:  uterine currettings  DISPOSITION OF SPECIMEN:  PATHOLOGY  COUNTS:  YES  TOURNIQUET:  * No tourniquets in log *  DICTATION: .Note written in EPIC  PLAN OF CARE: Discharge to home after PACU  PATIENT DISPOSITION:  PACU - hemodynamically stable.   Delay start of Pharmacological VTE agent (>24hrs) due to surgical blood loss or risk of bleeding: not applicable  Complications: none   Meds:  None  Findings:  Normal appearing endometrium.  No polyp was seen.  A small blind tract was made with initial dilator but no perforation was seen.  Hysteroscope deficit was 400cc and a lot was lost on the floor secondary the tube coming off the instrument at one point.  After adequate anesthesia was achieved, the patient was prepped and draped in the usual sterile fashion.  The speculum was placed in the vagina and the cervix stabilized with a single-tooth tenaculum.  The cervix was dilated with Shawnie PonsPratt dilators. The hysteroscope was passed into the cavity and the above findings noted.  There was no polyp or fibroid seen in the cavity. The smallest curette was used to remove crrettage just enough material to ensure the normalcy of the endometrium by pathology. All instruments were removed from the vagina and AgNo3 used to achieve hemostasis at tenaculum site.  The patient tolerated the procedure well.    Macarena Langseth A

## 2016-04-25 NOTE — Anesthesia Preprocedure Evaluation (Signed)
Anesthesia Evaluation  Patient identified by MRN, date of birth, ID band Patient awake    Reviewed: Allergy & Precautions, H&P , NPO status , Patient's Chart, lab work & pertinent test results, reviewed documented beta blocker date and time   Airway Mallampati: I  TM Distance: >3 FB Neck ROM: full    Dental no notable dental hx. (+) Teeth Intact   Pulmonary former smoker,    Pulmonary exam normal        Cardiovascular negative cardio ROS Normal cardiovascular exam     Neuro/Psych negative neurological ROS     GI/Hepatic negative GI ROS, Neg liver ROS,   Endo/Other  diabetes, Type 1  Renal/GU negative Renal ROS     Musculoskeletal   Abdominal Normal abdominal exam  (+)   Peds  Hematology negative hematology ROS (+)   Anesthesia Other Findings   Reproductive/Obstetrics negative OB ROS                             Anesthesia Physical Anesthesia Plan  ASA: II  Anesthesia Plan: General   Post-op Pain Management:    Induction: Intravenous  Airway Management Planned: LMA  Additional Equipment:   Intra-op Plan:   Post-operative Plan:   Informed Consent: I have reviewed the patients History and Physical, chart, labs and discussed the procedure including the risks, benefits and alternatives for the proposed anesthesia with the patient or authorized representative who has indicated his/her understanding and acceptance.   Dental Advisory Given  Plan Discussed with: CRNA and Surgeon  Anesthesia Plan Comments:         Anesthesia Quick Evaluation

## 2016-04-25 NOTE — Discharge Instructions (Signed)
DISCHARGE INSTRUCTIONS: D&C / D&E The following instructions have been prepared to help you care for yourself upon your return home.   Personal hygiene:  Use sanitary pads for vaginal drainage, not tampons.  Shower the day after your procedure.  NO tub baths, pools or Jacuzzis for 2-3 weeks.  Wipe front to back after using the bathroom.  Activity and limitations:  Do NOT drive or operate any equipment for 24 hours. The effects of anesthesia are still present and drowsiness may result.  Do NOT rest in bed all day.  Walking is encouraged.  Walk up and down stairs slowly.  You may resume your normal activity in one to two days or as indicated by your physician.  Sexual activity: NO intercourse for at least 2 weeks after the procedure, or as indicated by your physician.  Diet: Eat a light meal as desired this evening. You may resume your usual diet tomorrow.  Return to work: You may resume your work activities in one to two days or as indicated by your doctor.  What to expect after your surgery: Expect to have vaginal bleeding/discharge for 2-3 days and spotting for up to 10 days. It is not unusual to have soreness for up to 1-2 weeks. You may have a slight burning sensation when you urinate for the first day. Mild cramps may continue for a couple of days. You may have a regular period in 2-6 weeks.  Call your doctor for any of the following:  Excessive vaginal bleeding, saturating and changing one pad every hour.  Inability to urinate 6 hours after discharge from hospital.  Pain not relieved by pain medication.  Fever of 100.4 F or greater.  Unusual vaginal discharge or odor.  You may remove the patch behind your ear on or before Monday October 16. Wash your hands with soap and water after any contact with the patch.  Do not take ibuprofen/Motrin/Advil products until 1:40pm 04/25/16.

## 2016-04-25 NOTE — Progress Notes (Signed)
There has been no change in the patients history, status or exam since the history and physical.  Vitals:   04/25/16 0622  BP: 115/76  Pulse: 74  Resp: 16  Temp: 98.1 F (36.7 C)  TempSrc: Oral  SpO2: 98%    Lab Results  Component Value Date   WBC 6.5 04/14/2016   HGB 15.0 04/14/2016   HCT 44.3 04/14/2016   MCV 85.8 04/14/2016   PLT 329.0 04/14/2016    Amily Depp A

## 2016-04-25 NOTE — Transfer of Care (Signed)
Immediate Anesthesia Transfer of Care Note  Patient: Kristen Carrillo  Procedure(s) Performed: Procedure(s): DILATATION AND CURETTAGE /HYSTEROSCOPY (N/A) CERVICAL POLYPECTOMY (N/A)  Patient Location: PACU  Anesthesia Type:General  Level of Consciousness: awake, alert , oriented and patient cooperative  Airway & Oxygen Therapy: Patient Spontanous Breathing and Patient connected to nasal cannula oxygen  Post-op Assessment: Report given to RN and Post -op Vital signs reviewed and stable  Post vital signs: Reviewed and stable  Last Vitals:  Vitals:   04/25/16 0622  BP: 115/76  Pulse: 74  Resp: 16  Temp: 36.7 C    Last Pain:  Vitals:   04/25/16 0622  TempSrc: Oral  PainSc: 2       Patients Stated Pain Goal: 5 (04/25/16 0622)  Complications: No apparent anesthesia complications

## 2016-04-25 NOTE — Op Note (Signed)
04/25/2016  7:48 AM  PATIENT:  Kristen Carrillo  30 y.o. female  PRE-OPERATIVE DIAGNOSIS:  POLYP  POST-OPERATIVE DIAGNOSIS: normal endometrial cavity  PROCEDURE:  D&C, hysteroscopy  SURGEON:  Surgeon(s) and Role:    * Chrishana Spargur, MD - Primary   ANESTHESIA:   general  EBL:  Total I/O In: 1000 [I.V.:1000] Out: 160 [Urine:150; Blood:10]   SPECIMEN:  Source of Specimen:  uterine currettings  DISPOSITION OF SPECIMEN:  PATHOLOGY  COUNTS:  YES  TOURNIQUET:  * No tourniquets in log *  DICTATION: .Note written in EPIC  PLAN OF CARE: Discharge to home after PACU  PATIENT DISPOSITION:  PACU - hemodynamically stable.   Delay start of Pharmacological VTE agent (>24hrs) due to surgical blood loss or risk of bleeding: not applicable  Complications: none   Meds:  None  Findings:  Normal appearing endometrium.  No polyp was seen.  A small blind tract was made with initial dilator but no perforation was seen.  Hysteroscope deficit was 400cc and a lot was lost on the floor secondary the tube coming off the instrument at one point.  After adequate anesthesia was achieved, the patient was prepped and draped in the usual sterile fashion.  The speculum was placed in the vagina and the cervix stabilized with a single-tooth tenaculum.  The cervix was dilated with Pratt dilators. The hysteroscope was passed into the cavity and the above findings noted.  There was no polyp or fibroid seen in the cavity. The smallest curette was used to remove crrettage just enough material to ensure the normalcy of the endometrium by pathology. All instruments were removed from the vagina and AgNo3 used to achieve hemostasis at tenaculum site.  The patient tolerated the procedure well.    Broderic Bara A   

## 2016-04-28 ENCOUNTER — Encounter (HOSPITAL_COMMUNITY): Payer: Self-pay | Admitting: Obstetrics and Gynecology

## 2016-07-02 ENCOUNTER — Other Ambulatory Visit: Payer: Self-pay

## 2016-07-02 MED ORDER — NOVOLOG 100 UNIT/ML ~~LOC~~ SOLN: SUBCUTANEOUS | 3 refills | Status: DC

## 2016-07-11 ENCOUNTER — Telehealth: Payer: Self-pay

## 2016-07-11 NOTE — Telephone Encounter (Signed)
Faxed Rx Novolog 100U/ML to [facility]

## 2016-07-24 ENCOUNTER — Encounter: Payer: Self-pay | Admitting: Internal Medicine

## 2016-07-24 ENCOUNTER — Ambulatory Visit (INDEPENDENT_AMBULATORY_CARE_PROVIDER_SITE_OTHER): Payer: BC Managed Care – PPO | Admitting: Internal Medicine

## 2016-07-24 VITALS — BP 122/84 | HR 99 | Ht 68.0 in | Wt 174.0 lb

## 2016-07-24 DIAGNOSIS — E10649 Type 1 diabetes mellitus with hypoglycemia without coma: Secondary | ICD-10-CM | POA: Diagnosis not present

## 2016-07-24 LAB — POCT GLYCOSYLATED HEMOGLOBIN (HGB A1C): Hemoglobin A1C: 7.1

## 2016-07-24 NOTE — Patient Instructions (Signed)
Please change the pump settings as follows: - basal rates: 12 am: 1.45 2 am: 1.45 5 am: 1.8 8 am: 1.55 11 am: 1.5 8 pm: 1.6 - ICR:  12 am: 8.5 11 am: 9.0 >> 8.5 4 pm: 7.5 9 pm: 7.5 - target: 110-110 - ISF: 50 - Insulin on Board: 4h - bolus wizard: on  Please return in 1.5 months with your sugar log.
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
## 2016-09-25 ENCOUNTER — Ambulatory Visit (INDEPENDENT_AMBULATORY_CARE_PROVIDER_SITE_OTHER): Payer: BC Managed Care – PPO | Admitting: Internal Medicine

## 2016-09-25 ENCOUNTER — Encounter: Payer: Self-pay | Admitting: Internal Medicine

## 2016-09-25 VITALS — BP 118/72 | HR 91 | Ht 68.0 in | Wt 177.0 lb

## 2016-09-25 DIAGNOSIS — E10649 Type 1 diabetes mellitus with hypoglycemia without coma: Secondary | ICD-10-CM

## 2016-09-25 NOTE — Patient Instructions (Signed)
Please change the pump settings as follows: - basal rates: 12 am: 1.45 >> 1.55 2 am: 1.45 >> 1.55 5 am: 1.8 8 am: 1.55 11 am: 1.5 8 pm: 1.6 - ICR:  12 am: 8.5 11 am: 8.5 4 pm: 7.5 9 pm: 7.5 - target: 110-110 - ISF: 50, except 60 between 12 am-4 pm - Insulin on Board: 4h - bolus wizard: on  Please return in 1.5 months with your sugar log.
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
## 2016-10-26 ENCOUNTER — Other Ambulatory Visit: Payer: Self-pay | Admitting: Family Medicine

## 2016-12-11 ENCOUNTER — Encounter: Payer: Self-pay | Admitting: Internal Medicine

## 2016-12-11 ENCOUNTER — Ambulatory Visit (INDEPENDENT_AMBULATORY_CARE_PROVIDER_SITE_OTHER): Payer: BC Managed Care – PPO | Admitting: Internal Medicine

## 2016-12-11 VITALS — BP 130/84 | HR 91 | Wt 181.0 lb

## 2016-12-11 DIAGNOSIS — E10649 Type 1 diabetes mellitus with hypoglycemia without coma: Secondary | ICD-10-CM | POA: Diagnosis not present

## 2016-12-11 LAB — POCT GLYCOSYLATED HEMOGLOBIN (HGB A1C): HEMOGLOBIN A1C: 6.9
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
## 2016-12-11 NOTE — Patient Instructions (Addendum)
Please call Roxann Ripple to find out about the new pump.  Please check with your insurance if they cover carb counting class.  Please continue: - basal rates: 12 am: 1.55 2 am: 1.55 5 am: 1.8 8 am: 1.55 11 am: 1.5 8 pm: 1.6 - ICR:  12 am: 8.5 11 am: 8.5 4 pm: 7.5 9 pm: 7.5 - target: 110-110 - ISF: 50, except 60 between 12 am-4 pm - Insulin on Board: 4h - bolus wizard: on  Please return in 3 months with your sugar log.

## 2017-01-07 LAB — HM PAP SMEAR

## 2017-01-09 ENCOUNTER — Telehealth: Payer: Self-pay | Admitting: Internal Medicine

## 2017-01-09 NOTE — Telephone Encounter (Signed)
Cheapest cash price is regular insulin.  I don't know if this is cheapest with insurance, but we wold be happy to send rx.

## 2017-01-09 NOTE — Telephone Encounter (Signed)
Patient is asking if there is a generic or alternative version of novalog that she could request via her mail order pharmacy. She states that her current cost is $90 for 7 vials. Verified mobile #

## 2017-01-12 ENCOUNTER — Telehealth: Payer: Self-pay

## 2017-01-12 NOTE — Telephone Encounter (Signed)
Called and LVM for patient advising of MD note. I advised patient to call back to let me know if she would like to send in the regular insulin as we did not know how much cheaper it would be with insurance. Gave call back number.

## 2017-01-12 NOTE — Telephone Encounter (Signed)
Called and LVM for patient advising of MD note. I advised patient to call back to let me know if she would like to send in the regular insulin as we did not know how much cheaper it would be with insurance. Gave call back number.   
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
## 2017-02-23 LAB — HM DIABETES EYE EXAM

## 2017-03-24 ENCOUNTER — Ambulatory Visit (INDEPENDENT_AMBULATORY_CARE_PROVIDER_SITE_OTHER): Payer: BC Managed Care – PPO | Admitting: Internal Medicine

## 2017-03-24 ENCOUNTER — Other Ambulatory Visit: Payer: Self-pay

## 2017-03-24 ENCOUNTER — Encounter: Payer: Self-pay | Admitting: Internal Medicine

## 2017-03-24 VITALS — BP 102/62 | HR 75 | Wt 171.0 lb

## 2017-03-24 DIAGNOSIS — E10649 Type 1 diabetes mellitus with hypoglycemia without coma: Secondary | ICD-10-CM | POA: Diagnosis not present

## 2017-03-24 MED ORDER — NOVOLOG 100 UNIT/ML ~~LOC~~ SOLN: SUBCUTANEOUS | 3 refills | Status: DC
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
## 2017-03-24 NOTE — Patient Instructions (Addendum)
Please look up "The Engine 2 diet" by Juanetta Beetsip Esselstyn.  Also: Rich Roll - Finding Ultra Arvin[employer]itorScott Jurek - Eat and Run  Please watch: "Forks over [employer]".  Please check with your insurance if they cover carb counting class.  Please continue: - basal rates: 12 am: 1.55 5 am: 1.8 8 am: 1.55 11 am: 1.5 8 pm: 1.6 - ICR:  12 am: 8.5 11 am: 8.5 4 pm: 7.5 9 pm: 7.5 - target: 110-110 - ISF: 50, except 60 between 12 am-4 pm - Insulin on Board: 4h - bolus wizard: on  If you continue on the higher protein diet, count 50% of th proteins as carbs.  Please return in 3 months with your sugar log.

## 2017-03-25 LAB — POCT GLYCOSYLATED HEMOGLOBIN (HGB A1C): Hemoglobin A1C: 7.4

## 2017-03-25 NOTE — Addendum Note (Signed)
Addended by: Darene LamerHOMPSON, Reyah Streeter T on: 03/25/2017 08:01 AM   Modules accepted: Orders

## 2017-06-23 ENCOUNTER — Ambulatory Visit: Payer: BC Managed Care – PPO | Admitting: Internal Medicine

## 2017-08-24 ENCOUNTER — Telehealth: Payer: Self-pay | Admitting: Internal Medicine

## 2017-08-24 MED ORDER — GLUCOSE BLOOD VI STRP: ORAL_STRIP | 1 refills | Status: DC

## 2017-08-24 NOTE — Telephone Encounter (Signed)
Sent!

## 2017-08-24 NOTE — Telephone Encounter (Signed)
Pt received an insulin pump She states she has the meter (contour next) and she is needing a refill on Test strips Before she comes in to see Pilar JarvisLinda S for her training on Wednesday (08/26/17) She stated that Bonita QuinLinda told her to call and get this sent over before she comes in.     Please advise    [id number] - [place], [place] - [address]

## 2017-08-25 ENCOUNTER — Telehealth: Payer: Self-pay

## 2017-08-25 NOTE — Telephone Encounter (Signed)
Needed to do a medical necessity form filled out for her to be able to get the Contour Next test strips with her pump. Approved for 12 months.

## 2017-08-26 ENCOUNTER — Encounter: Payer: BC Managed Care – PPO | Attending: Internal Medicine | Admitting: Nutrition

## 2017-08-26 DIAGNOSIS — E10649 Type 1 diabetes mellitus with hypoglycemia without coma: Secondary | ICD-10-CM

## 2017-08-26 MED ORDER — GLUCOSE BLOOD VI STRP: ORAL_STRIP | 1 refills | Status: DC

## 2017-08-26 NOTE — Addendum Note (Signed)
Addended by: Yolande JollyLAWSON, Carnesha Maravilla on: 08/26/2017 01:55 PM   Modules accepted: Orders

## 2017-08-27 NOTE — Patient Instructions (Signed)
Read over booklets on pump and CGM  Read booklet on auto mode before coming to next visit. Call if questions.

## 2017-08-27 NOTE — Progress Notes (Signed)
Pt. Is here with her mother to be trained on how to use her 670G pump.  She had already set date/time on the pump, charged the sensor, and meter.   She was shown how to put the settings into the new pump and she did this per settings from her 530G pump.  Basal rate:  MN: 1.55, 5AM: 1.80, 8AM: 1.55, 11AM: 1.50, 8PM: 1.60.  I/C ratio: MN: 8.5, 4PM:7.5, ISF: MN: 60, 4PM: 50, Target: 110, timing: 4 hours.   She filled a cartridge and started her new pump.  She was shown how to give a bolus, respond to alerts and alarms and how to use the temp basal rate.   She had worn a sensor for a short time with her 530G, but got discouraged with all of the alerts and it's inaccuracy.   We discussed this new sensor's accuracy, and need for calibration q 8 hours.  She inserted a sensor, was shown how to tape this down, and how to start the sensor.  Settings were put in per her recommendations:  Hight-no alarm, low: 65, with suspend before low turning on.  We discussed this concept, and she reported good understanding of this, with no questions.  WE also discussed the concept of IOB, and she reported good understanding of this as well.  She signed off as understanding all topics on the CGM, and pump checklist and had no final questions.  Appt. Made for 2 week auto mode start.

## 2017-09-09 ENCOUNTER — Encounter: Payer: BC Managed Care – PPO | Admitting: Nutrition

## 2017-09-09 DIAGNOSIS — E10649 Type 1 diabetes mellitus with hypoglycemia without coma: Secondary | ICD-10-CM

## 2017-09-11 NOTE — Patient Instructions (Signed)
Test before all meals, Put carbs in for all meal boluses Do what the pump tells you, Calibrate the sensor every eight hours. Call if questions.

## 2017-09-11 NOTE — Progress Notes (Signed)
Patient and mother were trained on the auto mode via the slide presentation.  She put her pump in auto mode without difficulty.  She reported good understanding and had no final questions.   She has an appt. With Dr. Elvera LennoxGherghe in one week.  She was told to call if questions or problems.

## 2017-09-17 ENCOUNTER — Ambulatory Visit: Payer: BC Managed Care – PPO | Admitting: Internal Medicine

## 2017-09-24 ENCOUNTER — Other Ambulatory Visit: Payer: BC Managed Care – PPO

## 2017-09-24 ENCOUNTER — Encounter: Payer: Self-pay | Admitting: Family Medicine

## 2017-09-24 ENCOUNTER — Ambulatory Visit: Payer: Self-pay

## 2017-09-24 ENCOUNTER — Ambulatory Visit: Payer: BC Managed Care – PPO | Admitting: Family Medicine

## 2017-09-24 ENCOUNTER — Other Ambulatory Visit: Payer: Self-pay

## 2017-09-24 VITALS — BP 116/80 | HR 98 | Temp 99.5°F | Resp 17 | Ht 68.5 in | Wt 179.4 lb

## 2017-09-24 DIAGNOSIS — R5383 Other fatigue: Secondary | ICD-10-CM

## 2017-09-24 LAB — COMPREHENSIVE METABOLIC PANEL
ALK PHOS: 58 U/L (ref 39–117)
ALT: 14 U/L (ref 0–35)
AST: 13 U/L (ref 0–37)
Albumin: 3.7 g/dL (ref 3.5–5.2)
BILIRUBIN TOTAL: 0.4 mg/dL (ref 0.2–1.2)
BUN: 16 mg/dL (ref 6–23)
CALCIUM: 9.2 mg/dL (ref 8.4–10.5)
CO2: 25 mEq/L (ref 19–32)
CREATININE: 0.7 mg/dL (ref 0.40–1.20)
Chloride: 106 mEq/L (ref 96–112)
GFR: 104.04 mL/min (ref >60.00)
GLUCOSE: 102 mg/dL — AB (ref 70–99)
Potassium: 4.2 mEq/L (ref 3.5–5.1)
Sodium: 138 mEq/L (ref 135–145)
TOTAL PROTEIN: 6.5 g/dL (ref 6.0–8.3)

## 2017-09-24 LAB — CBC WITH DIFFERENTIAL/PLATELET
Basophils Absolute: 0.1 10*3/uL (ref 0.0–0.1)
Basophils Relative: 1.1 % (ref 0.0–3.0)
EOS ABS: 0.3 10*3/uL (ref 0.0–0.7)
Eosinophils Relative: 4 % (ref 0.0–5.0)
HEMATOCRIT: 44.4 % (ref 36.0–46.0)
Hemoglobin: 15.4 g/dL — ABNORMAL HIGH (ref 12.0–15.0)
LYMPHS PCT: 48.2 % — AB (ref 12.0–46.0)
Lymphs Abs: 3.3 10*3/uL (ref 0.7–4.0)
MCHC: 34.7 g/dL (ref 30.0–36.0)
MCV: 84.9 fl (ref 78.0–100.0)
MONOS PCT: 5.7 % (ref 3.0–12.0)
Monocytes Absolute: 0.4 10*3/uL (ref 0.1–1.0)
Neutro Abs: 2.8 10*3/uL (ref 1.4–7.7)
Neutrophils Relative %: 41 % — ABNORMAL LOW (ref 43.0–77.0)
Platelets: 281 10*3/uL (ref 150.0–400.0)
RBC: 5.23 Mil/uL — AB (ref 3.87–5.11)
RDW: 12.6 % (ref 11.5–15.5)
WBC: 6.8 10*3/uL (ref 4.0–10.5)

## 2017-09-24 LAB — MONONUCLEOSIS SCREEN: MONO SCREEN: NEGATIVE

## 2017-09-24 LAB — TSH: TSH: 2.47 u[IU]/mL (ref 0.35–4.50)

## 2017-09-24 MED ORDER — ONDANSETRON HCL 4 MG PO TABS: 4.0000 mg | ORAL_TABLET | Freq: Three times a day (TID) | ORAL | 0 refills | Status: DC | PRN

## 2017-09-24 NOTE — Addendum Note (Signed)
Addended by: Lenis DickinsonILLARD, Autymn Omlor M on: 09/24/2017 03:09 PM   Modules accepted: Orders

## 2017-09-24 NOTE — Telephone Encounter (Signed)
Pt calling with c/o "extreme" fatigue, weakness, headaches, nausea. Pt states the exhaustion and fatigue has gotten worse the last couple of days to the point that she is not eating solid foods. Pt had to leave work yesterday and called out of work today. Per disposition pt to see physician within 24 hours. Appt made for today at 11:00.  Reason for Disposition . [1] MODERATE weakness (i.e., interferes with work, school, normal activities) AND [2] persists > 3 days  Answer Assessment - Initial Assessment Questions 1. NAUSEA SEVERITY: "How bad is the nausea?" (e.g., mild, moderate, severe; dehydration, weight loss)   - MILD: loss of appetite without change in eating habits   - MODERATE: decreased oral intake without significant weight loss, dehydration, or malnutrition   - SEVERE: inadequate caloric or fluid intake, significant weight loss, symptoms of dehydration     severe 2. ONSET: "When did the nausea begin?"     2 weeks but has gotten worse last few days 3. VOMITING: "Any vomiting?" If so, ask: "How many times today?"     no 4. RECURRENT SYMPTOM: "Have you had nausea before?" If so, ask: "When was the last time?" "What happened that time?"     No-felt like but would subside as the day went on 5. CAUSE: "What do you think is causing the nausea?"     Meds= lorazepam and fluvoxamine (for anxiety) 6. PREGNANCY: "Is there any chance you are pregnant?" (e.g., unprotected intercourse, missed birth control pill, broken condom)     No LMP: on Nuva ring has no periods  Answer Assessment - Initial Assessment Questions 1. DESCRIPTION: "Describe how you are feeling."     Want all to sleep "all the time" 2. SEVERITY: "How bad is it?"  "Can you stand and walk?"   - MILD - Feels weak or tired, but does not interfere with work, school or normal activities   - MODERATE - Able to stand and walk; weakness interferes with work, school, or normal activities   - SEVERE - Unable to stand or walk      moderate 3. ONSET:  "When did the weakness begin?"     Past few weeks and past several days has gotten worse 4. CAUSE: "What do you think is causing the weakness?"     Not being able to eat due to the nausea 5. MEDICINES: "Have you recently started a new medicine or had a change in the amount of a medicine?"     no 6. OTHER SYMPTOMS: "Do you have any other symptoms?" (e.g., chest pain, fever, cough, SOB, vomiting, diarrhea, bleeding)     Headache, feeling overwhelmed 7. PREGNANCY: "Is there any chance you are pregnant?" "When was your last menstrual period?"     No- Nuva Ring pt has no periods  Protocols used: WEAKNESS (GENERALIZED) AND FATIGUE-A-AH, NAUSEA-A-AH

## 2017-09-24 NOTE — Progress Notes (Signed)
Subjective  CC:  Chief Complaint  Patient presents with  . Fatigue    Going on for 2 weeks, nausea in the am, headaches, hard to handle, leg pain    HPI: Kristen Carrillo is a 32 y.o. female who presents to the office today to address the problems listed above in the chief complaint.  32 year old well controlled type I diabetic on insulin pump last here in 2017 for an acute care visit.  She reports 2-week history of abrupt onset of nausea extreme fatigue, frontal headaches without vomiting, diarrhea, abdominal pain, fevers, chills, sore throat, rash, or joint pain.  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  She is stressed because she feels poorly and does not know why.  She has a history of well-controlled chronic depression on Luvox.  She does see a psychiatrist and therapist.  She reports her life is stable.  No out of the norm stressors.  She is a Patent examiner.  She denies history of known exposure to strep, mono, flu.  She has no upper respiratory tract symptoms.  She is on continuous NuvaRing and does not have menstrual cycles.  No history of anemia or thyroid problems.  Last A1c was 7.4. I reviewed the patients updated PMH, FH, and SocHx.    Patient Active Problem List   Diagnosis Date Noted  . OCD (obsessive compulsive disorder) 10/14/2015  . Attention deficit hyperactivity disorder (ADHD) 10/14/2015  . Fatigue 10/11/2015  . Type 1 diabetes mellitus with hypoglycemia and without coma ([place]) 09/19/2014  . DYSURIA 12/15/2007  . HEAT INTOLERANCE 12/15/2007   Current Meds  Medication Sig  . amphetamine-dextroamphetamine (ADDERALL) 20 MG tablet 2 tablets in the morning and 1 in the evening.  . Cholecalciferol (VITAMIN D3) 5000 units CAPS Take 1 capsule by mouth daily.  . Fluvoxamine Maleate 150 MG CP24 Take 1 capsule by mouth at bedtime.   Marland Kitchen glucagon (GLUCAGON EMERGENCY) 1 MG injection Glucagon Emergency Kit (human-recomb) 1 mg solution for  injection  . glucose blood (CONTOUR NEXT TEST) test strip Use as instructed to test 3 times daily  . hydrocortisone 2.5 % cream Apply 1 application topically daily as needed. Dry skin on eye lids.  Marland Kitchen LORazepam (ATIVAN) 0.5 MG tablet Take 0.5 mg by mouth daily as needed for anxiety.  Marland Kitchen NOVOLOG 100 UNIT/ML injection For use in insulin pump - use up to 80 units  . NUVARING 0.12-0.015 MG/24HR vaginal ring INSERT 1 RING VAGINALLY AS DIRECTED. REMOVE AFTER 3 WEEKS & WAIT 7 DAYS BEFORE INSERTING A NEW RING    Allergies: Patient is allergic to adhesive [tape]; colophony [pinus strobus]; formaldehyde; latex; and nickel. Family History: Patient family history includes Diabetes in her maternal grandfather, maternal uncle, and mother; Heart disease in her maternal grandmother and mother; Hyperlipidemia in her maternal grandfather and mother; Hypertension in her maternal grandfather, maternal grandmother, maternal uncle, and mother; Thyroid disease in her mother. Social History:  Patient  reports that she quit smoking about 8 years ago. Her smoking use included cigarettes. she has never used smokeless tobacco. She reports that she drinks alcohol. She reports that she does not use drugs.  Review of Systems: Constitutional: Negative for fever malaise or anorexia Cardiovascular: negative for chest pain Respiratory: negative for SOB or persistent cough Gastrointestinal: negative for abdominal pain  Objective  Vitals: BP 116/80   Pulse 98   Temp 99.5 F (37.5 C) (Oral)   Resp  17   Ht 5' 8.5" (1.74 m)   Wt 179 lb 6.4 oz (81.4 kg)   SpO2 98%   BMI 26.88 kg/m  General: no acute distress , A&Ox3, appears well Psych: Normal affect, good insight, normal speech, no hypokinesis HEENT: PEERL, conjunctiva normal, Oropharynx moist,neck is supple, no anterior or posterior cervical lymphadenopathy Cardiovascular:  RRR without murmur or gallop.  Respiratory:  Good breath sounds bilaterally, CTAB with normal  respiratory effort Gastrointestinal: Normal abdominal exam without HSM, tenderness, normal bowel sounds Skin:  Warm, contact dermatitis due to adhesive tape from insulin pump.  Assessment  1. Fatigue, unspecified type      Plan   Fatigue: Unclear etiology.  Could be consistent with viral syndrome.  Check for mono.  Counseling done.  Lab work to assess thyroid function, liver function tests, blood cell counts etc.  Zofran given for nausea.  No possible pregnancy.  No active symptoms of depression or anxiety although she is concerned about that she can follow-up with her psychiatrist.  Recheck in 1-2 weeks.  Further recommendations based on results and symptoms.  Follow up: No Follow-up on file.    Commons side effects, risks, benefits, and alternatives for medications and treatment plan prescribed today were discussed, and the patient expressed understanding of the given instructions. Patient is instructed to call or message via MyChart if he/she has any questions or concerns regarding our treatment plan. No barriers to understanding were identified. We discussed Red Flag symptoms and signs in detail. Patient expressed understanding regarding what to do in case of urgent or emergency type symptoms.   Medication list was reconciled, printed and provided to the patient in AVS. Patient instructions and summary information was reviewed with the patient as documented in the AVS. This note was prepared with assistance of Dragon voice recognition software. Occasional wrong-word or sound-a-like substitutions may have occurred due to the inherent limitations of voice recognition software  Orders Placed This Encounter  Procedures  . CBC with Differential/Platelet  . Comprehensive metabolic panel  . TSH  . Monospot   Meds ordered this encounter  Medications  . ondansetron (ZOFRAN) 4 MG tablet    Sig: Take 1 tablet (4 mg total) by mouth every 8 (eight) hours as needed for nausea or vomiting.     Dispense:  20 tablet    Refill:  0

## 2017-09-24 NOTE — Patient Instructions (Signed)
Please return in 1-2 weeks for recheck. With Dr. Mardelle MatteAndy or Dr. Beverely Lowabori.  If you have any questions or concerns, please don't hesitate to send me a message via MyChart or call the office at [phone]. Thank you for visiting with us today! It's our pleasure caring for you.

## 2017-09-28 LAB — EPSTEIN-BARR VIRUS VCA, IGM

## 2017-09-28 LAB — TEST AUTHORIZATION

## 2017-09-28 LAB — EPSTEIN-BARR VIRUS NUCLEAR ANTIGEN ANTIBODY, IGG: EBV NA IgG: 142 U/mL — ABNORMAL HIGH

## 2017-09-28 NOTE — Progress Notes (Signed)
Please call patient: I have reviewed his/her lab results. Please call patient to see how she is doing. Still feeling poorly or have thing improved. All lab work was OK; unfortunately, didn't get complete EBV panel: what we did get shows old exposure but no new infection. F/u if things are not improving as instructed. Thanks.

## 2017-10-05 ENCOUNTER — Encounter: Payer: Self-pay | Admitting: Family Medicine

## 2017-10-05 ENCOUNTER — Other Ambulatory Visit: Payer: Self-pay

## 2017-10-05 ENCOUNTER — Ambulatory Visit: Payer: BC Managed Care – PPO | Admitting: Family Medicine

## 2017-10-05 VITALS — BP 117/81 | HR 81 | Temp 98.1°F | Resp 16 | Ht 69.0 in | Wt 180.1 lb

## 2017-10-05 DIAGNOSIS — R5383 Other fatigue: Secondary | ICD-10-CM | POA: Diagnosis not present

## 2017-10-05 NOTE — Patient Instructions (Signed)
Follow up by phone or MyChart in 2-3 weeks to let me know how you're feeling We'll notify you of your lab results and make any changes if needed Try and get regular exercise/activity to improve your energy level Call with any questions or concerns Hang in there!!

## 2017-10-05 NOTE — Progress Notes (Signed)
   Subjective:    Patient ID: Kristen Carrillo, female    DOB: 1985-10-14, 32 y.o.   MRN: [id number]  HPI Fatigue- pt saw Dr Mardelle MatteAndy on 3/14 weeks ago.  Labs at that time were normal.  2 weeks ago missed a day of work 'bc I couldn't handle it'.  Pt checked in with her therapist last week who did not feel that depression was the cause of her fatigue.  Pt denies any increased stress.  Pt has hx of Vit D deficiency- currently taking 5,000 units daily OTC.  Pt reports sleeping well at night.  No reported loud snoring or breathing pauses.  Pt denies excessive seasonal allergies.  Has decreased her activity recently due to work demands.   Review of Systems For ROS see HPI     Objective:   Physical Exam  Constitutional: She is oriented to person, place, and time. She appears well-developed and well-nourished. No distress.  HENT:  Head: Normocephalic and atraumatic.  Eyes: Pupils are equal, round, and reactive to light. Conjunctivae and EOM are normal.  Neck: Normal range of motion. Neck supple. No thyromegaly present.  Cardiovascular: Normal rate, regular rhythm, normal heart sounds and intact distal pulses.  No murmur heard. Pulmonary/Chest: Effort normal and breath sounds normal. No respiratory distress.  Abdominal: Soft. She exhibits no distension. There is no tenderness.  Musculoskeletal: She exhibits no edema.  Lymphadenopathy:    She has no cervical adenopathy.  Neurological: She is alert and oriented to person, place, and time.  Skin: Skin is warm and dry.  Psychiatric: She has a normal mood and affect. Her behavior is normal.  Vitals reviewed.         Assessment & Plan:

## 2017-10-05 NOTE — Assessment & Plan Note (Signed)
Pt reports she typically has low level of fatigue but this has worsened x3 weeks.  Therapist does not feel that this is mood related.  Pt has hx of this at the same time 2 yrs ago and at that time, labs were also WNL.  Will check B12 and folate to determine if there is a deficiency.  Encouraged her to resume exercise to build her energy level.  Will follow.

## 2017-10-06 ENCOUNTER — Other Ambulatory Visit: Payer: Self-pay

## 2017-10-06 LAB — B12 AND FOLATE PANEL
Folate: 12.2 ng/mL
Vitamin B-12: 715 pg/mL (ref 200–1100)

## 2017-10-06 LAB — VITAMIN D 25 HYDROXY (VIT D DEFICIENCY, FRACTURES): VIT D 25 HYDROXY: 33 ng/mL (ref 30–100)

## 2017-10-06 MED ORDER — TRANSPARENT I.V. SITE DRESSING MISC: 1 refills | Status: DC

## 2017-11-05 ENCOUNTER — Ambulatory Visit (INDEPENDENT_AMBULATORY_CARE_PROVIDER_SITE_OTHER): Payer: BC Managed Care – PPO | Admitting: Internal Medicine

## 2017-11-05 ENCOUNTER — Encounter: Payer: Self-pay | Admitting: Internal Medicine

## 2017-11-05 VITALS — BP 118/78 | HR 72 | Resp 16 | Ht 68.35 in | Wt 180.2 lb

## 2017-11-05 DIAGNOSIS — E10649 Type 1 diabetes mellitus with hypoglycemia without coma: Secondary | ICD-10-CM

## 2017-11-05 LAB — POCT GLYCOSYLATED HEMOGLOBIN (HGB A1C): Hemoglobin A1C: 7.3
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
## 2017-11-05 NOTE — Patient Instructions (Addendum)
  Please check with your insurance if they cover carb counting class.  Try Flonase or Anti-itch cream on the skin before attaching the sensor.  Please continue: - basal rates: 12 am: 1.55 5 am: 1.8 8 am: 1.55 11 am: 1.5 8 pm: 1.6 - ICR:  12 am: 8.5 11 am: 8.5 4 pm: 7.5 9 pm: 7.5 - target: 110-110 - ISF: 50, except 60 between 12 am-4 pm - Insulin on Board: 4h - bolus wizard: on  Please return in 3 months.

## 2018-01-07 ENCOUNTER — Other Ambulatory Visit: Payer: Self-pay | Admitting: General Practice

## 2018-01-07 ENCOUNTER — Other Ambulatory Visit: Payer: Self-pay

## 2018-01-07 ENCOUNTER — Encounter: Payer: Self-pay | Admitting: Family Medicine

## 2018-01-07 ENCOUNTER — Ambulatory Visit: Payer: BC Managed Care – PPO | Admitting: Family Medicine

## 2018-01-07 VITALS — BP 110/62 | HR 82 | Temp 98.3°F | Resp 16 | Ht 68.0 in | Wt 183.5 lb

## 2018-01-07 DIAGNOSIS — H6123 Impacted cerumen, bilateral: Secondary | ICD-10-CM | POA: Diagnosis not present

## 2018-01-07 DIAGNOSIS — E10649 Type 1 diabetes mellitus with hypoglycemia without coma: Secondary | ICD-10-CM | POA: Diagnosis not present

## 2018-01-07 DIAGNOSIS — R7989 Other specified abnormal findings of blood chemistry: Secondary | ICD-10-CM

## 2018-01-07 DIAGNOSIS — R945 Abnormal results of liver function studies: Secondary | ICD-10-CM

## 2018-01-07 LAB — BASIC METABOLIC PANEL
BUN: 12 mg/dL (ref 6–23)
CHLORIDE: 104 meq/L (ref 96–112)
CO2: 28 mEq/L (ref 19–32)
Calcium: 9.3 mg/dL (ref 8.4–10.5)
Creatinine, Ser: 0.71 mg/dL (ref 0.40–1.20)
GFR: 102.15 mL/min (ref >60.00)
Glucose, Bld: 161 mg/dL — ABNORMAL HIGH (ref 70–99)
POTASSIUM: 4.5 meq/L (ref 3.5–5.1)
Sodium: 138 mEq/L (ref 135–145)

## 2018-01-07 LAB — HEPATIC FUNCTION PANEL
ALT: 29 U/L (ref 0–35)
AST: 44 U/L — ABNORMAL HIGH (ref 0–37)
Albumin: 3.9 g/dL (ref 3.5–5.2)
Alkaline Phosphatase: 69 U/L (ref 39–117)
BILIRUBIN DIRECT: 0.1 mg/dL (ref 0.0–0.3)
BILIRUBIN TOTAL: 0.4 mg/dL (ref 0.2–1.2)
Total Protein: 6.5 g/dL (ref 6.0–8.3)

## 2018-01-07 LAB — MICROALBUMIN / CREATININE URINE RATIO
CREATININE, U: 148.6 mg/dL
MICROALB/CREAT RATIO: 0.5 mg/g (ref 0.0–30.0)
Microalb, Ur: <0.7 mg/dL (ref 0.0–1.9)

## 2018-01-07 LAB — CBC WITH DIFFERENTIAL/PLATELET
BASOS ABS: 0.1 10*3/uL (ref 0.0–0.1)
Basophils Relative: 1 % (ref 0.0–3.0)
EOS PCT: 6 % — AB (ref 0.0–5.0)
Eosinophils Absolute: 0.4 10*3/uL (ref 0.0–0.7)
HEMATOCRIT: 43.9 % (ref 36.0–46.0)
HEMOGLOBIN: 15.1 g/dL — AB (ref 12.0–15.0)
LYMPHS PCT: 44.9 % (ref 12.0–46.0)
Lymphs Abs: 2.7 10*3/uL (ref 0.7–4.0)
MCHC: 34.4 g/dL (ref 30.0–36.0)
MCV: 84.6 fl (ref 78.0–100.0)
MONOS PCT: 6.7 % (ref 3.0–12.0)
Monocytes Absolute: 0.4 10*3/uL (ref 0.1–1.0)
Neutro Abs: 2.5 10*3/uL (ref 1.4–7.7)
Neutrophils Relative %: 41.4 % — ABNORMAL LOW (ref 43.0–77.0)
Platelets: 288 10*3/uL (ref 150.0–400.0)
RBC: 5.19 Mil/uL — AB (ref 3.87–5.11)
RDW: 12.8 % (ref 11.5–15.5)
WBC: 6 10*3/uL (ref 4.0–10.5)

## 2018-01-07 LAB — VITAMIN D 25 HYDROXY (VIT D DEFICIENCY, FRACTURES): VITD: 22.79 ng/mL — AB (ref 30.00–100.00)

## 2018-01-07 LAB — TSH: TSH: 1.51 u[IU]/mL (ref 0.35–4.50)

## 2018-01-07 MED ORDER — VITAMIN D (ERGOCALCIFEROL) 1.25 MG (50000 UNIT) PO CAPS: 50000.0000 [IU] | ORAL_CAPSULE | ORAL | 0 refills | Status: DC

## 2018-01-07 NOTE — Progress Notes (Signed)
   Subjective:    Patient ID: Kristen Carrillo, female    DOB: 1985-12-11, 32 y.o.   MRN: [id number]  HPI DM- chronic problem.  Following w/ Dr Elvera LennoxGherghe.  Overdue for foot exam and microalbumin.  UTD on eye exam.  No CP, SOB, HAs, visual changes, no numbness/tingling of hands/feet.  Denies symptomatic lows.  Decreased hearing- pt has had decreased hearing due to wax x2 weeks.  L>R.  No drainage from ear, no pain.   Review of Systems For ROS see HPI     Objective:   Physical Exam  Constitutional: She is oriented to person, place, and time. She appears well-developed and well-nourished. No distress.  HENT:  Head: Normocephalic and atraumatic.  TMs obscured by cerumen bilaterally- unable to remove w/ curette.  Successfully irrigated  Eyes: Pupils are equal, round, and reactive to light. Conjunctivae and EOM are normal.  Neck: Normal range of motion. Neck supple. No thyromegaly present.  Cardiovascular: Normal rate, regular rhythm, normal heart sounds and intact distal pulses.  No murmur heard. Pulmonary/Chest: Effort normal and breath sounds normal. No respiratory distress.  Musculoskeletal: She exhibits no edema.  Lymphadenopathy:    She has no cervical adenopathy.  Neurological: She is alert and oriented to person, place, and time.  Skin: Skin is warm and dry.  Vitals reviewed.         Assessment & Plan:  Cerumen impaction bilaterally- new.  This was the cause of her decreased hearing.  sxs improved immediately after irrigation.  Pt was pleased w/ results.

## 2018-01-07 NOTE — Assessment & Plan Note (Signed)
Chronic problem.  Following w/ Dr Elvera LennoxGherghe.  Foot exam done today and microalbumin ordered.  Check lipids, TSH.  Adjust tx plan prn.

## 2018-01-07 NOTE — Patient Instructions (Addendum)
Schedule your complete physical in 6 months We'll notify you of your lab results and make any changes if needed (and fax to psych) Call with any questions or concerns Have a great summer!!

## 2018-01-21 ENCOUNTER — Other Ambulatory Visit (INDEPENDENT_AMBULATORY_CARE_PROVIDER_SITE_OTHER): Payer: BC Managed Care – PPO

## 2018-01-21 ENCOUNTER — Encounter: Payer: Self-pay | Admitting: General Practice

## 2018-01-21 DIAGNOSIS — R945 Abnormal results of liver function studies: Secondary | ICD-10-CM

## 2018-01-21 DIAGNOSIS — R7989 Other specified abnormal findings of blood chemistry: Secondary | ICD-10-CM

## 2018-01-21 LAB — HEPATIC FUNCTION PANEL
ALBUMIN: 3.7 g/dL (ref 3.5–5.2)
ALT: 11 U/L (ref 0–35)
AST: 13 U/L (ref 0–37)
Alkaline Phosphatase: 58 U/L (ref 39–117)
BILIRUBIN TOTAL: 0.5 mg/dL (ref 0.2–1.2)
Bilirubin, Direct: 0.1 mg/dL (ref 0.0–0.3)
Total Protein: 6.3 g/dL (ref 6.0–8.3)

## 2018-03-12 ENCOUNTER — Ambulatory Visit: Payer: BC Managed Care – PPO | Admitting: Internal Medicine

## 2018-03-12 ENCOUNTER — Encounter: Payer: Self-pay | Admitting: Internal Medicine

## 2018-03-12 VITALS — BP 120/70 | HR 90 | Ht 68.0 in | Wt 185.6 lb

## 2018-03-12 DIAGNOSIS — E10649 Type 1 diabetes mellitus with hypoglycemia without coma: Secondary | ICD-10-CM | POA: Diagnosis not present

## 2018-03-12 MED ORDER — INSULIN ASPART (W/NIACINAMIDE) 100 UNIT/ML ~~LOC~~ SOLN: 80.0000 [IU] | Freq: Every day | SUBCUTANEOUS | 3 refills | Status: DC
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
## 2018-03-12 NOTE — Patient Instructions (Signed)
Please change - basal rates: 12 am: 1.55 >> 1.65 5 am:  1.8  8 am: 1.55 11 am: 1.5 8 pm: 1.6 - ICR:  12 am: 8.5 11 am: 8.5 >> 7.5 4 pm: 7.5  9 pm: 7.5 - target: 110-110 - ISF: 50, except 60 between 12 am-4 pm - Insulin on Board: 4h - bolus wizard:   Try FiAsp instead of NovoLog.  Please return in 3 months.

## 2018-03-16 LAB — HM DIABETES EYE EXAM

## 2018-03-16 LAB — POCT GLYCOSYLATED HEMOGLOBIN (HGB A1C): Hemoglobin A1C: 7.1 % — AB (ref 4.0–5.6)

## 2018-03-16 NOTE — Addendum Note (Signed)
Addended by: Yolande Jolly on: 03/16/2018 08:04 AM   Modules accepted: Orders

## 2018-03-18 ENCOUNTER — Telehealth: Payer: Self-pay | Admitting: Family Medicine

## 2018-03-18 ENCOUNTER — Encounter: Payer: Self-pay | Admitting: Family Medicine

## 2018-03-18 ENCOUNTER — Other Ambulatory Visit: Payer: Self-pay

## 2018-03-18 ENCOUNTER — Ambulatory Visit: Payer: BC Managed Care – PPO | Admitting: Family Medicine

## 2018-03-18 VITALS — BP 110/80 | HR 81 | Temp 98.7°F | Resp 16 | Ht 68.0 in | Wt 184.2 lb

## 2018-03-18 DIAGNOSIS — N921 Excessive and frequent menstruation with irregular cycle: Secondary | ICD-10-CM | POA: Diagnosis not present

## 2018-03-18 NOTE — Progress Notes (Signed)
   Subjective:    Patient ID: Kristen Carrillo, female    DOB: Feb 19, 1986, 32 y.o.   MRN: [id number]  HPI Menorrhagia- pt inserted new NuvaRing on 8/21 but started a period on 8/25 and has been bleeding since.  Denies missing days w/ NuvaRing or late insertion.  Pt denies that this has fallen out- able to feel ring in place.  Pt has had irregular bleeding when on OCPs.  Pt has GYN- Dr Henderson Cloud.  Pt reports increased abdominal bloating.  + cramping- L>R.  Pt is having excessive daytime drowsiness despite good sleep at night.  No chance of pregnancy.   Review of Systems For ROS see HPI     Objective:   Physical Exam  Constitutional: She appears well-developed and well-nourished. No distress.  HENT:  Head: Normocephalic and atraumatic.  Abdominal: Bowel sounds are normal. She exhibits distension (mild). There is no hepatosplenomegaly. There is tenderness (mild TTP) in the left upper quadrant and left lower quadrant. There is no rebound and no guarding.  Skin: Skin is warm and dry. No pallor.  Vitals reviewed.         Assessment & Plan:  Menorrhagia- new.  Pt inserted a new NuvaRing on 8/21 but started bleeding on 8/25 and has been bleeding since.  Since I do not manage her birth control, I am hesitant to make change in her regimen.  She is also having abdominal bloating and cramping.  She reports there is no chance of pregnancy.  Increased fatigue recently- must r/o anemia from blood loss or thyroid abnormality.  Called GYN office and asked if they are able to see pt sooner than her upcoming yearly appt (scheduled in the next month) to address the bleeding.  If she does not hear from office by Monday- she is to let me know.  Reviewed supportive care and red flags that should prompt return.  Pt expressed understanding and is in agreement w/ plan.

## 2018-03-18 NOTE — Telephone Encounter (Signed)
FYI, I have faxed office note for 9/5 to [facility]. I will follow up with a call tomorrow to see if an appt has been made and make you aware in case a referral is needed for pt.

## 2018-03-18 NOTE — Patient Instructions (Addendum)
We called Dr Kittie Plater office and we are working on getting you a sooner appt If you have not heard from their office by Monday- let me know! Try OTC Pamprin/Midol for cramping and bloating Drink plenty of fluids We'll notify you of your lab results and make any changes if needed Call with any questions or concerns Hang in there!!!

## 2018-03-19 LAB — CBC WITH DIFFERENTIAL/PLATELET
BASOS PCT: 0.8 % (ref 0.0–3.0)
Basophils Absolute: 0.1 10*3/uL (ref 0.0–0.1)
EOS PCT: 1.6 % (ref 0.0–5.0)
Eosinophils Absolute: 0.1 10*3/uL (ref 0.0–0.7)
HEMATOCRIT: 40.8 % (ref 36.0–46.0)
HEMOGLOBIN: 14.1 g/dL (ref 12.0–15.0)
LYMPHS PCT: 37.9 % (ref 12.0–46.0)
Lymphs Abs: 3.5 10*3/uL (ref 0.7–4.0)
MCHC: 34.7 g/dL (ref 30.0–36.0)
MCV: 83.5 fl (ref 78.0–100.0)
MONOS PCT: 6.5 % (ref 3.0–12.0)
Monocytes Absolute: 0.6 10*3/uL (ref 0.1–1.0)
Neutro Abs: 4.9 10*3/uL (ref 1.4–7.7)
Neutrophils Relative %: 53.2 % (ref 43.0–77.0)
Platelets: 296 10*3/uL (ref 150.0–400.0)
RBC: 4.88 Mil/uL (ref 3.87–5.11)
RDW: 13 % (ref 11.5–15.5)
WBC: 9.2 10*3/uL (ref 4.0–10.5)

## 2018-03-19 LAB — TSH: TSH: 1.44 u[IU]/mL (ref 0.35–4.50)

## 2018-03-19 NOTE — Telephone Encounter (Signed)
Thank you.  Hopefully they will get her scheduled

## 2018-03-22 ENCOUNTER — Encounter: Payer: Self-pay | Admitting: General Practice

## 2018-04-02 NOTE — Progress Notes (Signed)
[facility] Note  04/05/2018     CHIEF COMPLAINT Patient presents for Retina Evaluation and Diabetic Eye Exam   HISTORY OF PRESENT ILLNESS: Kristen Carrillo is a 32 y.o. female who presents to the clinic today for:   HPI    Retina Evaluation    In both eyes.  This started 10 years ago.  Associated Symptoms Photophobia.  Negative for Flashes, Blind Spot, Scalp Tenderness, Fever, Floaters, Pain, Glare, Weight Loss, Jaw Claudication, Distortion, Redness, Trauma, Shoulder/Hip pain and Fatigue.  Context:  distance vision, mid-range vision and near vision.  Treatments tried include no treatments.  I, the attending physician,  performed the HPI with the patient and updated documentation appropriately.          Diabetic Eye Exam    Vision is stable.  Associated Symptoms Negative for Flashes, Blind Spot, Photophobia, Scalp Tenderness, Jaw Claudication, Glare, Pain, Floaters, Distortion, Redness, Trauma, Shoulder/Hip pain, Fatigue, Weight Loss and Fever.  Diabetes characteristics include Type 1 and on insulin.  This started 22 years ago.  Blood sugar level is controlled.  Last Blood Glucose 160.  Last A1C 7.1.  I, the attending physician,  performed the HPI with the patient and updated documentation appropriately.          Comments    Referral of Dr.Groat for retina eval.possible  Lattice Degen atopic holes OD. Patient states she has been diabetic for 22 yrs, BS 160 this am,A1C 7.0 few months ago, Pt is on insulin pump, BS are usually WINL per patient. Pt states she has been sensitive to light OU for years.           Last edited by Bernarda Caffey, MD on 04/05/2018  2:55 PM. (History)    Pt states she was seen by Dr. Katy Fitch for routine exam; Pt states she had been seeing Dr. Cena Benton previously; Pt denies ever seeing floaters, denies flashes, denies wavy VA; Pt states she has always been very near sighted;   Referring physician: Midge Minium, MD [address]  [place], [place] [address]  HISTORICAL INFORMATION:   Selected notes from the MEDICAL RECORD NUMBER Referred by Dr. Wyatt Portela for concern of lattice degeneration OU and atrophic holes OD LEE: 09.03.19 (S. Groat) [BCVA: OD: 20/25 OS:20/25] Ocular Hx-high myopia PMH-anxiety, depression, OCD, DM1 (takes novolog)    CURRENT MEDICATIONS: No current outpatient medications on file. (Ophthalmic Drugs)   No current facility-administered medications for this visit.  (Ophthalmic Drugs)   Current Outpatient Medications (Other)  Medication Sig  . amphetamine-dextroamphetamine (ADDERALL) 20 MG tablet 2 tablets in the morning and 1 in the evening.  . Cholecalciferol (VITAMIN D3) 5000 units CAPS Take 1 capsule by mouth daily.  . Fluvoxamine Maleate 150 MG CP24 Take 2 capsules by mouth at bedtime.   Marland Kitchen glucagon (GLUCAGON EMERGENCY) 1 MG injection Glucagon Emergency Kit (human-recomb) 1 mg solution for injection  . glucose blood (CONTOUR NEXT TEST) test strip Use as instructed to test 3 times daily  . hydrocortisone 2.5 % cream Apply 1 application topically daily as needed. Dry skin on eye lids.  . Insulin Aspart, w/Niacinamide, (FIASP) 100 UNIT/ML SOLN Inject 80 Units into the skin daily. Use in insulin pump  . LORazepam (ATIVAN) 0.5 MG tablet Take 0.5 mg by mouth daily as needed for anxiety.  Marland Kitchen NOVOLOG 100 UNIT/ML injection For use in insulin pump - use up to 80 units  . NUVARING 0.12-0.015 MG/24HR vaginal ring INSERT  1 RING VAGINALLY AS DIRECTED. REMOVE AFTER 3 WEEKS & WAIT 7 DAYS BEFORE INSERTING A NEW RING  . Transparent Dressings (TRANSPARENT I.V. SITE DRESSING) MISC Use before inserting infusion set. Apply 1 dressing every 2 days  . Vitamin D, Ergocalciferol, (DRISDOL) 50000 units CAPS capsule Take 1 capsule (50,000 Units total) by mouth every 7 (seven) days.  . ondansetron (ZOFRAN) 4 MG tablet Take 1 tablet (4 mg total) by mouth every 8 (eight) hours as needed for nausea or vomiting. (Patient not  taking: Reported on 04/05/2018)   No current facility-administered medications for this visit.  (Other)      REVIEW OF SYSTEMS: ROS    Positive for: Endocrine, Eyes   Negative for: Constitutional, Gastrointestinal, Neurological, Skin, Genitourinary, Musculoskeletal, HENT, Cardiovascular, Respiratory, Psychiatric, Allergic/Imm, Heme/Lymph   Last edited by Zenovia Jordan, LPN on 3/41/9622  2:97 PM. (History)       ALLERGIES Allergies  Allergen Reactions  . Adhesive [Tape] Rash  . Colophony [Pinus Strobus] Hives and Rash  . Formaldehyde Hives and Rash    Like a burn  . Latex Rash  . Nickel Hives and Rash    PAST MEDICAL HISTORY Past Medical History:  Diagnosis Date  . Anxiety   . Depression   . Diabetes mellitus without complication [facility])    DMT1 age 41 Dr. Benjiman Core [facility])  . Pneumonia    2005   Past Surgical History:  Procedure Laterality Date  . CERVICAL POLYPECTOMY N/A 04/25/2016   Procedure: CERVICAL POLYPECTOMY;  Surgeon: Bobbye Charleston, MD;  Location: [facility];  Service: Gynecology;  Laterality: N/A;  . HYSTEROSCOPY W/D&C N/A 04/25/2016   Procedure: DILATATION AND CURETTAGE /HYSTEROSCOPY;  Surgeon: Bobbye Charleston, MD;  Location: [facility];  Service: Gynecology;  Laterality: N/A;  . MULTIPLE TOOTH EXTRACTIONS    . TYMPANOSTOMY TUBE PLACEMENT  age 72    FAMILY HISTORY Family History  Problem Relation Age of Onset  . Diabetes Mother   . Hypertension Mother   . Hyperlipidemia Mother   . Thyroid disease Mother   . Heart disease Mother   . Diabetes Maternal Uncle   . Hypertension Maternal Uncle   . Hypertension Maternal Grandmother   . Heart disease Maternal Grandmother   . Diabetes Maternal Grandfather   . Hypertension Maternal Grandfather   . Hyperlipidemia Maternal Grandfather     SOCIAL HISTORY Social History   Tobacco Use  . Smoking status: Former Smoker    Types: Cigarettes    Last attempt to quit: 07/14/2009    Years since quitting:  8.7  . Smokeless tobacco: Never Used  Substance Use Topics  . Alcohol use: Yes    Alcohol/week: 0.0 standard drinks    Comment: occasionally  . Drug use: No         OPHTHALMIC EXAM:  Base Eye Exam    Visual Acuity (Snellen - Linear)      Right Left   Dist cc 20/30 20/30   Dist ph cc 20/25 NI   Correction:  Glasses       Tonometry (Tonopen, 1:53 PM)      Right Left   Pressure 14 16       Pupils      Dark Light Shape React APD   Right 4 3 Round Brisk None   Left 4 3 Round Brisk None       Visual Fields (Counting fingers)      Left Right    Full Full  Extraocular Movement      Right Left    Full, Ortho Full, Ortho       Neuro/Psych    Oriented x3:  Yes   Mood/Affect:  Normal       Dilation    Both eyes:  1.0% Mydriacyl, 2.5% Phenylephrine @ 1:53 PM        Slit Lamp and Fundus Exam    Slit Lamp Exam      Right Left   Lids/Lashes Meibomian gland dysfunction Meibomian gland dysfunction   Conjunctiva/Sclera White and quiet White and quiet   Cornea 1+ Punctate epithelial erosions 1+ Punctate epithelial erosions   Anterior Chamber Deep and quiet Deep and quiet   Iris Round and dilated Round and dilated   Lens Clear Clear   Vitreous Vitreous syneresis, Posterior vitreous detachment Vitreous syneresis       Fundus Exam      Right Left   Disc Temporal Peripapillary atrophy Temporal Peripapillary atrophy   C/D Ratio 0.2 0.2   Macula Good foveal reflex, Retinal pigment epithelial mottling, No heme or edema Good foveal reflex, Retinal pigment epithelial mottling, No heme or edema   Vessels Normal Normal   Periphery Attached, VR tuft at 0630 equator, Superior Lattice degeneration at ora, peripheral lattice SN quadrant, cobblestones at 0400 Attached, Lattice degeneration from 0500 to 0600, superior lattice from 1030 to 0100        Refraction    Wearing Rx      Sphere Cylinder Axis   Right -9.50 +3.25 092   Left -8.75 +3.25 082   Age:  32yr  Type:   SVL       Manifest Refraction      Sphere Cylinder Axis Dist VA   Right -9.50 +3.00 105 20/25   Left -8.75 +3.25 080 20/25+          IMAGING AND PROCEDURES  Imaging and Procedures for '@TODAY'$ @  OCT, Retina - OU - Both Eyes       Right Eye Quality was good. Central Foveal Thickness: 302. Progression has no prior data. Findings include normal foveal contour, no IRF, no SRF.   Left Eye Quality was good. Central Foveal Thickness: 301. Progression has no prior data. Findings include normal foveal contour, no IRF, no SRF.   Notes *Images captured and stored on drive  Diagnosis / Impression:  NFP, No IRF/SRF OU  Clinical management:  See below  Abbreviations: NFP - Normal foveal profile. CME - cystoid macular edema. PED - pigment epithelial detachment. IRF - intraretinal fluid. SRF - subretinal fluid. EZ - ellipsoid zone. ERM - epiretinal membrane. ORA - outer retinal atrophy. ORT - outer retinal tubulation. SRHM - subretinal hyper-reflective material                  ASSESSMENT/PLAN:    ICD-10-CM   1. Bilateral retinal lattice degeneration H35.413   2. 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 Type 1 diabetes mellitus without retinopathy ([place] E10.9   4. Retinal edema H35.81 OCT, Retina - OU - Both Eyes  5. 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 Lattice degeneration with atrophic retinal holes, OU -  - superior lattice at ora, peripheral lattice SN quadrant, VR tuft at 630 equator OD - superior lattice from 1030 to 0100, lattice from 0500 to 0600 OS - discussed findings, prognosis, and treatment options including observation - recommend laser retinopexy OU, OD first - pt wishes to return for laser retinopexy in next  week or 2 - Reviewed s/s of RT/RD -- Strict return precautions for any such RT/RD signs/symptoms - f/u for lattice recheck and laser  3. Diabetes mellitus, type 1 without retinopathy - The incidence, risk factors for progression, natural history and  treatment options for diabetic retinopathy  were discussed with patient.   - The need for close monitoring of blood glucose, blood pressure, and serum lipids, avoiding cigarette or any type of tobacco, and the need for long term follow up was also discussed with patient. - f/u in 1 year, sooner prn  4. No retinal edema on exam or OCT  5. High Myopia OU-  - discussed association of myopia with lattice degen and RT/RD - doing well with current specs - under the expert care of Dr. Katy Fitch  Ophthalmic Meds Ordered this visit:  No orders of the defined types were placed in this encounter.    .  Return in about 3 weeks (around 04/26/2018) for F/U lattice OU, DFE, laser ret.  There are no Patient Instructions on file for this visit.   Explained the diagnoses, plan, and follow up with the patient and they expressed understanding.  Patient expressed understanding of the importance of proper follow up care.   This document serves as a record of services personally performed by Gardiner Sleeper, MD, PhD. It was created on their behalf by Ernest Mallick, OA, an ophthalmic assistant. The creation of this record is the provider's dictation and/or activities during the visit.    Electronically signed by: Ernest Mallick, OA  09.20.19 10:13 PM   This document serves as a record of services personally performed by Gardiner Sleeper, MD, PhD. It was created on their behalf by Catha Brow, [place], a certified ophthalmic assistant. The creation of this record is the provider's dictation and/or activities during the visit.  Electronically signed by: Catha Brow, COA  09.23.19 10:13 PM   Gardiner Sleeper, M.D., Ph.D. Diseases & Surgery of the Retina and Vitreous [facility]   I have reviewed the above documentation for accuracy and completeness, and I agree with the above. Gardiner Sleeper, M.D., Ph.D. 04/05/18 10:13 PM   Abbreviations: M myopia (nearsighted); A astigmatism; H  hyperopia (farsighted); P presbyopia; Mrx spectacle prescription;  CTL contact lenses; OD right eye; OS left eye; OU both eyes  XT exotropia; ET esotropia; PEK punctate epithelial keratitis; PEE punctate epithelial erosions; DES dry eye syndrome; MGD meibomian gland dysfunction; ATs artificial tears; PFAT's preservative free artificial tears; [place] nuclear sclerotic cataract; PSC posterior subcapsular cataract; ERM epi-retinal membrane; PVD posterior vitreous detachment; RD retinal detachment; DM diabetes mellitus; DR diabetic retinopathy; NPDR non-proliferative diabetic retinopathy; PDR proliferative diabetic retinopathy; CSME clinically significant macular edema; DME diabetic macular edema; dbh dot blot hemorrhages; CWS cotton wool spot; POAG primary open angle glaucoma; C/D cup-to-disc ratio; HVF humphrey visual field; GVF goldmann visual field; OCT optical coherence tomography; IOP intraocular pressure; BRVO Branch retinal vein occlusion; CRVO central retinal vein occlusion; CRAO central retinal artery occlusion; BRAO branch retinal artery occlusion; RT retinal tear; SB scleral buckle; PPV pars plana vitrectomy; VH Vitreous hemorrhage; PRP panretinal laser photocoagulation; IVK intravitreal kenalog; VMT vitreomacular traction; MH Macular hole;  NVD neovascularization of the disc; NVE neovascularization elsewhere; AREDS age related eye disease study; ARMD age related macular degeneration; POAG primary open angle glaucoma; EBMD epithelial/anterior basement membrane dystrophy; ACIOL anterior chamber intraocular lens; IOL intraocular lens; PCIOL posterior chamber intraocular lens; Phaco/IOL phacoemulsification with intraocular lens placement; [place]  photorefractive keratectomy; LASIK laser assisted in situ keratomileusis; HTN hypertension; DM diabetes mellitus; COPD chronic obstructive pulmonary disease

## 2018-04-05 ENCOUNTER — Encounter (INDEPENDENT_AMBULATORY_CARE_PROVIDER_SITE_OTHER): Payer: Self-pay | Admitting: Ophthalmology

## 2018-04-05 ENCOUNTER — Ambulatory Visit (INDEPENDENT_AMBULATORY_CARE_PROVIDER_SITE_OTHER): Payer: BC Managed Care – PPO | Admitting: Ophthalmology

## 2018-04-05 DIAGNOSIS — H3581 Retinal edema: Secondary | ICD-10-CM | POA: Diagnosis not present

## 2018-04-05 DIAGNOSIS — E109 Type 1 diabetes mellitus without complications: Secondary | ICD-10-CM | POA: Diagnosis not present

## 2018-04-05 DIAGNOSIS — H35413 Lattice degeneration of retina, bilateral: Secondary | ICD-10-CM

## 2018-04-05 DIAGNOSIS — H33323 Round hole, bilateral: Secondary | ICD-10-CM

## 2018-04-05 DIAGNOSIS — H5213 Myopia, bilateral: Secondary | ICD-10-CM

## 2018-04-13 LAB — HM PAP SMEAR

## 2018-04-14 ENCOUNTER — Encounter: Payer: Self-pay | Admitting: General Practice

## 2018-04-15 NOTE — Progress Notes (Signed)
[facility] Note  04/16/2018     CHIEF COMPLAINT Patient presents for Retina Follow Up   HISTORY OF PRESENT ILLNESS: Kristen Carrillo is a 32 y.o. female who presents to the clinic today for:   HPI    Retina Follow Up    Patient presents with  Other.  In both eyes.  This started 22 years ago.  Severity is moderate.  Duration of 3 weeks.  I, the attending physician,  performed the HPI with the patient and updated documentation appropriately.          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 Possible laser. Patient has type 1 diabetes. Last blood glucose was 59 about 1:30 pm (1.5 hours ago), patient ate something and feels OK right now. Patient denies floaters/flashes. Vision seems about the same OU. A1c was 7.1 end of August/early September 2019.        Last edited by Bernarda Caffey, MD on 04/16/2018  3:39 PM. (History)      Referring physician: Midge Minium, MD [id number] [place], [place] [address]  HISTORICAL INFORMATION:   Selected notes from the MEDICAL RECORD NUMBER Referred by Dr. Wyatt Portela for concern of lattice degeneration OU and atrophic holes OD LEE: 09.03.19 (S. Groat) [BCVA: OD: 20/25 OS:20/25] Ocular Hx-high myopia PMH-anxiety, depression, OCD, DM1 (takes novolog)    CURRENT MEDICATIONS: No current outpatient medications on file. (Ophthalmic Drugs)   No current facility-administered medications for this visit.  (Ophthalmic Drugs)   Current Outpatient Medications (Other)  Medication Sig  . amphetamine-dextroamphetamine (ADDERALL) 20 MG tablet 2 tablets in the morning and 1 in the evening.  . Cholecalciferol (VITAMIN D3) 5000 units CAPS Take 1 capsule by mouth daily.  . Fluvoxamine Maleate 150 MG CP24 Take 2 capsules by mouth at bedtime.   Marland Kitchen glucagon (GLUCAGON EMERGENCY) 1 MG injection Glucagon Emergency Kit (human-recomb) 1 mg solution for injection  . glucose blood (CONTOUR NEXT TEST)  test strip Use as instructed to test 3 times daily  . hydrocortisone 2.5 % cream Apply 1 application topically daily as needed. Dry skin on eye lids.  . Insulin Aspart, w/Niacinamide, (FIASP) 100 UNIT/ML SOLN Inject 80 Units into the skin daily. Use in insulin pump  . Insulin Aspart, w/Niacinamide, (FIASP) 100 UNIT/ML SOLN as directed.  Marland Kitchen LORazepam (ATIVAN) 0.5 MG tablet Take 0.5 mg by mouth daily as needed for anxiety.  Marland Kitchen NOVOLOG 100 UNIT/ML injection For use in insulin pump - use up to 80 units  . NUVARING 0.12-0.015 MG/24HR vaginal ring INSERT 1 RING VAGINALLY AS DIRECTED. REMOVE AFTER 3 WEEKS & WAIT 7 DAYS BEFORE INSERTING A NEW RING  . ondansetron (ZOFRAN) 4 MG tablet Take 1 tablet (4 mg total) by mouth every 8 (eight) hours as needed for nausea or vomiting.  . ondansetron (ZOFRAN) 4 MG tablet Take 4 mg by mouth as directed.  . Transparent Dressings (TRANSPARENT I.V. SITE DRESSING) MISC Use before inserting infusion set. Apply 1 dressing every 2 days  . Vitamin D, Ergocalciferol, (DRISDOL) 50000 units CAPS capsule Take 1 capsule (50,000 Units total) by mouth every 7 (seven) days.   No current facility-administered medications for this visit.  (Other)      REVIEW OF SYSTEMS: ROS    Positive for: Endocrine, Eyes   Negative for: Constitutional, Gastrointestinal, Neurological, Skin, Genitourinary, Musculoskeletal, HENT, Cardiovascular, Respiratory, Psychiatric, Allergic/Imm, Heme/Lymph   Last edited by Jobe Marker on  04/16/2018  2:55 PM. (History)       ALLERGIES Allergies  Allergen Reactions  . Adhesive [Tape] Rash  . Colophony [Pinus Strobus] Hives and Rash  . Formaldehyde Hives and Rash    Like a burn  . Latex Rash  . Nickel Hives and Rash    PAST MEDICAL HISTORY Past Medical History:  Diagnosis Date  . Anxiety   . Depression   . Diabetes mellitus without complication [facility])    DMT1 age 76 Dr. Benjiman Core Velora Heckler)  . Pneumonia    2005   Past Surgical  History:  Procedure Laterality Date  . CERVICAL POLYPECTOMY N/A 04/25/2016   Procedure: CERVICAL POLYPECTOMY;  Surgeon: Bobbye Charleston, MD;  Location: [facility];  Service: Gynecology;  Laterality: N/A;  . HYSTEROSCOPY W/D&C N/A 04/25/2016   Procedure: DILATATION AND CURETTAGE /HYSTEROSCOPY;  Surgeon: Bobbye Charleston, MD;  Location: [facility];  Service: Gynecology;  Laterality: N/A;  . MULTIPLE TOOTH EXTRACTIONS    . TYMPANOSTOMY TUBE PLACEMENT  age 85    FAMILY HISTORY Family History  Problem Relation Age of Onset  . Diabetes Mother   . Hypertension Mother   . Hyperlipidemia Mother   . Thyroid disease Mother   . Heart disease Mother   . Diabetes Maternal Uncle   . Hypertension Maternal Uncle   . Hypertension Maternal Grandmother   . Heart disease Maternal Grandmother   . Diabetes Maternal Grandfather   . Hypertension Maternal Grandfather   . Hyperlipidemia Maternal Grandfather     SOCIAL HISTORY Social History   Tobacco Use  . Smoking status: Former Smoker    Types: Cigarettes    Last attempt to quit: 07/14/2009    Years since quitting: 8.7  . Smokeless tobacco: Never Used  Substance Use Topics  . Alcohol use: Yes    Alcohol/week: 0.0 standard drinks    Comment: occasionally  . Drug use: No         OPHTHALMIC EXAM:  Base Eye Exam    Visual Acuity (Snellen - Linear)      Right Left   Dist cc 20/25 20/30 +2   Dist ph cc 20/20 -2 20/25 +1   Correction:  Glasses       Tonometry (Tonopen, 3:00 PM)      Right Left   Pressure 13 12       Pupils      Dark Light Shape React APD   Right 4 3 Round Brisk None   Left 4 3 Round Brisk None       Visual Fields (Counting fingers)      Left Right    Full Full       Extraocular Movement      Right Left    Full, Ortho Full, Ortho       Neuro/Psych    Oriented x3:  Yes   Mood/Affect:  Normal       Dilation    Both eyes:  1.0% Mydriacyl, 2.5% Phenylephrine @ 3:00 PM        Slit Lamp and Fundus Exam    Slit  Lamp Exam      Right Left   Lids/Lashes Meibomian gland dysfunction Meibomian gland dysfunction   Conjunctiva/Sclera White and quiet White and quiet   Cornea 1+ Punctate epithelial erosions 1+ Punctate epithelial erosions   Anterior Chamber Deep and quiet Deep and quiet   Iris Round and dilated Round and dilated   Lens Clear Clear   Vitreous Vitreous syneresis, Posterior vitreous  detachment Vitreous syneresis       Fundus Exam      Right Left   Disc Temporal Peripapillary atrophy Temporal Peripapillary atrophy   C/D Ratio 0.2 0.2   Macula Good foveal reflex, Retinal pigment epithelial mottling, No heme or edema Good foveal reflex, Retinal pigment epithelial mottling, No heme or edema   Vessels Normal Normal   Periphery Attached, VR tuft at 0530 equator, Superior Lattice degeneration at ora, peripheral lattice SN quadrant, cobblestones at 0400 Attached, Lattice degeneration from 0500 to 0600, superior lattice from 1030 to 0100        Refraction    Wearing Rx      Sphere Cylinder Axis   Right -9.50 +3.25 092   Left -8.75 +3.25 082   Type:  SVL          IMAGING AND PROCEDURES  Imaging and Procedures for _0 @  Repair Retinal Breaks, Laser - OD - Right Eye       LASER PROCEDURE NOTE  Procedure:  Barrier laser retinopexy using slit lamp laser, RIGHT eye   Diagnosis:   Lattice degeneration with atrophic retinal holes, RIGHT eye                     Patches of lattice from 1100-0100                         VR tuft at 530 equator   Surgeon: Bernarda Caffey, MD, PhD  Anesthesia: Topical  Informed consent obtained, operative eye marked, and time out performed prior to initiation of laser.   Laser settings:  Lumenis Smart532 laser, slit lamp Lens: [place] PRP 165 Power: 270 mW Spot size: 200 microns Duration: 30 msec  # spots: [id number]  Placement of laser: Using a [place]ster PRP 165 contact lens at the slit lamp, laser was placed in three confluent rows around superior  patches of lattice and VR tuft at 530 equator.  Complications: None.  Patient tolerated the procedure well and received written and verbal post-procedure care information/education.                  ASSESSMENT/PLAN:    ICD-10-CM   1. Bilateral retinal lattice degeneration H35.413 Repair Retinal Breaks, Laser - OD - Right Eye  2. 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 Type 1 diabetes mellitus without retinopathy ([place]) E10.9   4. Retinal edema H35.81   5. 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 Lattice degeneration with atrophic retinal holes, OU -  - superior lattice at ora, peripheral lattice SN quadrant, VR tuft at 530 equator OD - superior lattice from 1030 to 0100, lattice from 0500 to 0600 OS - discussed findings, prognosis, and treatment options including observation - recommend laser retinopexy OU, OD first - pt wishes to proceed - RBA of procedure discussed, questions answered - informed consent obtained and signed - see procedure note - start PF OD QID x 7 days - F/U 2-3 weeks, DFE, laser ret OS  3. Diabetes mellitus, type 1 without retinopathy - The incidence, risk factors for progression, natural history and treatment options for diabetic retinopathy  were discussed with patient.   - The need for close monitoring of blood glucose, blood pressure, and serum lipids, avoiding cigarette or any type of tobacco, and the need for long term follow up was also discussed with patient. - f/u in 1 year, sooner prn  4. No retinal edema on exam or OCT  5. High Myopia OU-  - discussed association of myopia with lattice degen and RT/RD - doing well with current specs - under the expert care of Dr. Katy Fitch  Ophthalmic Meds Ordered this visit:  No orders of the defined types were placed in this encounter.    .  Return in about 2 weeks (around 04/30/2018) for F/U laser ret OD, DFE.  There are no Patient Instructions on file for  this visit.   Explained the diagnoses, plan, and follow up with the patient and they expressed understanding.  Patient expressed understanding of the importance of proper follow up care.   This document serves as a record of services personally performed by Gardiner Sleeper, MD, PhD. It was created on their behalf by Ernest Mallick, OA, an ophthalmic assistant. The creation of this record is the provider's dictation and/or activities during the visit.    Electronically signed by: Ernest Mallick, OA  10.03.19 4:08 PM   This document serves as a record of services personally performed by Gardiner Sleeper, MD, PhD. It was created on their behalf by Catha Brow, [place], a certified ophthalmic assistant. The creation of this record is the provider's dictation and/or activities during the visit.  Electronically signed by: Catha Brow, COA  10.04.19 4:08 PM   Gardiner Sleeper, M.D., Ph.D. Diseases & Surgery of the Retina and Vitreous [facility]   I have reviewed the above documentation for accuracy and completeness, and I agree with the above. Gardiner Sleeper, M.D., Ph.D. 04/16/18 4:08 PM    Abbreviations: M myopia (nearsighted); A astigmatism; H hyperopia (farsighted); P presbyopia; Mrx spectacle prescription;  CTL contact lenses; OD right eye; OS left eye; OU both eyes  XT exotropia; ET esotropia; PEK punctate epithelial keratitis; PEE punctate epithelial erosions; DES dry eye syndrome; MGD meibomian gland dysfunction; ATs artificial tears; PFAT's preservative free artificial tears; [place] nuclear sclerotic cataract; PSC posterior subcapsular cataract; ERM epi-retinal membrane; PVD posterior vitreous detachment; RD retinal detachment; DM diabetes mellitus; DR diabetic retinopathy; NPDR non-proliferative diabetic retinopathy; PDR proliferative diabetic retinopathy; CSME clinically significant macular edema; DME diabetic macular edema; dbh dot blot hemorrhages; CWS cotton wool  spot; POAG primary open angle glaucoma; C/D cup-to-disc ratio; HVF humphrey visual field; GVF goldmann visual field; OCT optical coherence tomography; IOP intraocular pressure; BRVO Branch retinal vein occlusion; CRVO central retinal vein occlusion; CRAO central retinal artery occlusion; BRAO branch retinal artery occlusion; RT retinal tear; SB scleral buckle; PPV pars plana vitrectomy; VH Vitreous hemorrhage; PRP panretinal laser photocoagulation; IVK intravitreal kenalog; VMT vitreomacular traction; MH Macular hole;  NVD neovascularization of the disc; NVE neovascularization elsewhere; AREDS age related eye disease study; ARMD age related macular degeneration; POAG primary open angle glaucoma; EBMD epithelial/anterior basement membrane dystrophy; ACIOL anterior chamber intraocular lens; IOL intraocular lens; PCIOL posterior chamber intraocular lens; Phaco/IOL phacoemulsification with intraocular lens placement; [place] photorefractive keratectomy; LASIK laser assisted in situ keratomileusis; HTN hypertension; DM diabetes mellitus; COPD chronic obstructive pulmonary disease

## 2018-04-16 ENCOUNTER — Encounter (INDEPENDENT_AMBULATORY_CARE_PROVIDER_SITE_OTHER): Payer: Self-pay | Admitting: Ophthalmology

## 2018-04-16 ENCOUNTER — Ambulatory Visit (INDEPENDENT_AMBULATORY_CARE_PROVIDER_SITE_OTHER): Payer: BC Managed Care – PPO | Admitting: Ophthalmology

## 2018-04-16 DIAGNOSIS — H3581 Retinal edema: Secondary | ICD-10-CM | POA: Diagnosis not present

## 2018-04-16 DIAGNOSIS — E109 Type 1 diabetes mellitus without complications: Secondary | ICD-10-CM

## 2018-04-16 DIAGNOSIS — H35413 Lattice degeneration of retina, bilateral: Secondary | ICD-10-CM | POA: Diagnosis not present

## 2018-04-16 DIAGNOSIS — H33323 Round hole, bilateral: Secondary | ICD-10-CM

## 2018-04-16 DIAGNOSIS — H5213 Myopia, bilateral: Secondary | ICD-10-CM

## 2018-04-16 LAB — HM PAP SMEAR: HPV Aptima: NEGATIVE

## 2018-04-16 MED ORDER — PREDNISOLONE ACETATE 1 % OP SUSP: 1.0000 [drp] | Freq: Four times a day (QID) | OPHTHALMIC | 0 refills | Status: AC

## 2018-04-16 NOTE — Addendum Note (Signed)
Addended by: Karie Chimera on: 04/16/2018 04:33 PM   Modules accepted: Orders

## 2018-05-07 ENCOUNTER — Encounter (INDEPENDENT_AMBULATORY_CARE_PROVIDER_SITE_OTHER): Payer: BC Managed Care – PPO | Admitting: Ophthalmology

## 2018-05-07 DIAGNOSIS — H35413 Lattice degeneration of retina, bilateral: Secondary | ICD-10-CM

## 2018-05-09 ENCOUNTER — Other Ambulatory Visit: Payer: Self-pay | Admitting: Internal Medicine

## 2018-05-12 NOTE — Progress Notes (Signed)
[facility] Note  05/14/2018     CHIEF COMPLAINT Patient presents for Retina Follow Up   HISTORY OF PRESENT ILLNESS: Kristen Carrillo is a 32 y.o. female who presents to the clinic today for:   HPI    Retina Follow Up    Patient presents with  Other.  In both eyes.  I, the attending physician,  performed the HPI with the patient and updated documentation appropriately.          Comments    S/P laser retinopexy OD (04/16/2018). Patient here for laser retinopexy OS-Patient states no new floaters or flashes OU.       Last edited by Bernarda Caffey, MD on 05/14/2018  2:45 PM. (History)    pt states everything has been going well over the past week, pt saw Dr. Zigmund Daniel last week and he said everything looked good in her right eye  Referring physician: Midge Minium, MD [address] [place], [place] [address]  HISTORICAL INFORMATION:   Selected notes from the MEDICAL RECORD NUMBER Referred by Dr. Wyatt Portela for concern of lattice degeneration OU and atrophic holes OD LEE: 09.03.19 (S. Groat) [BCVA: OD: 20/25 OS:20/25] Ocular Hx-high myopia PMH-anxiety, depression, OCD, DM1 (takes novolog)    CURRENT MEDICATIONS: No current outpatient medications on file. (Ophthalmic Drugs)   No current facility-administered medications for this visit.  (Ophthalmic Drugs)   Current Outpatient Medications (Other)  Medication Sig  . amphetamine-dextroamphetamine (ADDERALL) 20 MG tablet 2 tablets in the morning and 1 in the evening.  . Cholecalciferol (VITAMIN D3) 5000 units CAPS Take 1 capsule by mouth daily.  . Fluvoxamine Maleate 150 MG CP24 Take 2 capsules by mouth at bedtime.   Marland Kitchen glucagon (GLUCAGON EMERGENCY) 1 MG injection Glucagon Emergency Kit (human-recomb) 1 mg solution for injection  . glucose blood (CONTOUR NEXT TEST) test strip Use as instructed to test 3 times daily  . hydrocortisone 2.5 % cream Apply 1 application topically daily as needed.  Dry skin on eye lids.  . Insulin Aspart, w/Niacinamide, (FIASP) 100 UNIT/ML SOLN Inject 80 Units into the skin daily. Use in insulin pump  . Insulin Aspart, w/Niacinamide, (FIASP) 100 UNIT/ML SOLN as directed.  Marland Kitchen LORazepam (ATIVAN) 0.5 MG tablet Take 0.5 mg by mouth daily as needed for anxiety.  Marland Kitchen NOVOLOG 100 UNIT/ML injection FOR USE IN INSULIN PUMP - USE UP TO 80 UNITS  . NUVARING 0.12-0.015 MG/24HR vaginal ring INSERT 1 RING VAGINALLY AS DIRECTED. REMOVE AFTER 3 WEEKS & WAIT 7 DAYS BEFORE INSERTING A NEW RING  . ondansetron (ZOFRAN) 4 MG tablet Take 1 tablet (4 mg total) by mouth every 8 (eight) hours as needed for nausea or vomiting.  . Transparent Dressings (TRANSPARENT I.V. SITE DRESSING) MISC Use before inserting infusion set. Apply 1 dressing every 2 days  . Vitamin D, Ergocalciferol, (DRISDOL) 50000 units CAPS capsule Take 1 capsule (50,000 Units total) by mouth every 7 (seven) days.  . ondansetron (ZOFRAN) 4 MG tablet Take 4 mg by mouth as directed.   No current facility-administered medications for this visit.  (Other)      REVIEW OF SYSTEMS: ROS    Positive for: Endocrine, Eyes   Negative for: Constitutional, Gastrointestinal, Neurological, Skin, Genitourinary, Musculoskeletal, HENT, Cardiovascular, Respiratory, Psychiatric, Allergic/Imm, Heme/Lymph   Last edited by [name] on 05/14/2018  2:14 PM. (History)       ALLERGIES Allergies  Allergen Reactions  . Adhesive [Tape] Rash  .  Colophony [Pinus Strobus] Hives and Rash  . Formaldehyde Hives and Rash    Like a burn  . Latex Rash  . Nickel Hives and Rash    PAST MEDICAL HISTORY Past Medical History:  Diagnosis Date  . Anxiety   . Depression   . Diabetes mellitus without complication [facility])    DMT1 age 63 Dr. Benjiman Core Vel[facility]ler)  . Pneumonia    2005   Past Surgical History:  Procedure Laterality Date  . CERVICAL POLYPECTOMY N/A 04/25/2016   Procedure: CERVICAL POLYPECTOMY;  Surgeon: Bobbye Charleston, MD;  Location: [facility];  Service: Gynecology;  Laterality: N/A;  . HYSTEROSCOPY W/D&C N/A 04/25/2016   Procedure: DILATATION AND CURETTAGE /HYSTEROSCOPY;  Surgeon: Bobbye Charleston, MD;  Location: [facility];  Service: Gynecology;  Laterality: N/A;  . MULTIPLE TOOTH EXTRACTIONS    . TYMPANOSTOMY TUBE PLACEMENT  age 32    FAMILY HISTORY Family History  Problem Relation Age of Onset  . Diabetes Mother   . Hypertension Mother   . Hyperlipidemia Mother   . Thyroid disease Mother   . Heart disease Mother   . Diabetes Maternal Uncle   . Hypertension Maternal Uncle   . Hypertension Maternal Grandmother   . Heart disease Maternal Grandmother   . Diabetes Maternal Grandfather   . Hypertension Maternal Grandfather   . Hyperlipidemia Maternal Grandfather     SOCIAL HISTORY Social History   Tobacco Use  . Smoking status: Former Smoker    Types: Cigarettes    Last attempt to quit: 07/14/2009    Years since quitting: 8.8  . Smokeless tobacco: Never Used  Substance Use Topics  . Alcohol use: Yes    Alcohol/week: 0.0 standard drinks    Comment: occasionally  . Drug use: No         OPHTHALMIC EXAM:  Base Eye Exam    Visual Acuity (Snellen - Linear)      Right Left   Dist cc 20/25 +2 20/25 +2   Dist ph cc 20/20 20/25 +1   Correction:  Glasses       Tonometry (Tonopen, 2:28 PM)      Right Left   Pressure 15 16       Pupils      Dark Light Shape React APD   Right 4 3 Round Brisk None   Left 4 3 Round Brisk None       Visual Fields (Counting fingers)      Left Right    Full Full       Extraocular Movement      Right Left    Full, Ortho Full, Ortho       Neuro/Psych    Oriented x3:  Yes   Mood/Affect:  Normal       Dilation    Both eyes:  1.0% Mydriacyl, 2.5% Phenylephrine @ 2:29 PM        Slit Lamp and Fundus Exam    Slit Lamp Exam      Right Left   Lids/Lashes Meibomian gland dysfunction Meibomian gland dysfunction   Conjunctiva/Sclera White and  quiet White and quiet   Cornea 1+ Punctate epithelial erosions 1+ Punctate epithelial erosions   Anterior Chamber Deep and quiet Deep and quiet   Iris Round and dilated Round and dilated   Lens Clear Clear   Vitreous Vitreous syneresis, Posterior vitreous detachment Vitreous syneresis       Fundus Exam      Right Left   Disc Temporal Peripapillary  atrophy Temporal Peripapillary atrophy   C/D Ratio 0.2 0.2   Macula Good foveal reflex, Retinal pigment epithelial mottling, No heme or edema Good foveal reflex, Retinal pigment epithelial mottling, No heme or edema   Vessels Normal Normal   Periphery Attached, VR tuft at 0530 equator, Superior Lattice degeneration at ora, peripheral lattice SN quadrant, cobblestones at 0400 -- good laser surrounding all lesions Attached, Lattice degeneration from 0500 to 0600, superior lattice from 1000 to 0200        Refraction    Wearing Rx      Sphere Cylinder Axis   Right -9.50 +3.25 092   Left -8.75 +3.25 082   Type:  SVL          IMAGING AND PROCEDURES  Imaging and Procedures for '@TODAY'$ @  Repair Retinal Breaks, Laser - OS - Left Eye       LASER PROCEDURE NOTE  Procedure:  Barrier laser retinopexy using slit lamp laser, LEFT eye   Diagnosis:   Lattice degeneration w/ atrophic holes, LEFT eye                     Patches of lattice: 1000-0200 superiorly; 0500-0600 inferiorly  Surgeon: Bernarda Caffey, MD, PhD  Anesthesia: Topical  Informed consent obtained, operative eye marked, and time out performed prior to initiation of laser.   Laser settings:  Lumenis Smart532 laser, slit lamp Lens: [place] PRP 165 Power: 220 mW Spot size: 200 microns Duration: 30 msec  # spots: 1038  Placement of laser: Using a [place] PRP 165 contact lens at the slit lamp, laser was placed in three confluent rows around patches of lattice w/ atrophic holes superiorly from 10-2 and inferiorly from 5-6 oclock anterior to equator with additional rows  anteriorly.  Complications: None.  Patient tolerated the procedure well and received written and verbal post-procedure care information/education.                  ASSESSMENT/PLAN:    ICD-10-CM   1. Bilateral retinal lattice degeneration H35.413 Repair Retinal Breaks, Laser - OS - Left Eye  2. 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 Retinal edema H35.81   5. 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 Lattice degeneration with atrophic retinal holes, OU -  - superior lattice at ora, peripheral lattice SN quadrant, VR tuft at 530 equator OD - superior lattice from 1000 to 0200, lattice from 0500 to 0600 OS - discussed findings, prognosis, and treatment options including observation - S/P laser retinopexy OD (10.04.19) -- good laser surrounding lattice - recommend laser retinopexy OS today, 11.01.19 - pt wishes to proceed - RBA of procedure discussed, questions answered - informed consent obtained and signed - see procedure note - start PF OS QID x 7 days - F/U 2-3 weeks, POV/DFE  3. Diabetes mellitus, type 1 without retinopathy - The incidence, risk factors for progression, natural history and treatment options for diabetic retinopathy  were discussed with patient.   - The need for close monitoring of blood glucose, blood pressure, and serum lipids, avoiding cigarette or any type of tobacco, and the need for long term follow up was also discussed with patient. - f/u in 1 year, sooner prn  4. No retinal edema on exam or OCT  5. High Myopia OU-  - discussed association of myopia with lattice degen and RT/RD - doing  well with current specs - under the expert care of Dr. Katy Fitch  Ophthalmic Meds Ordered this visit:  No orders of the defined types were placed in this encounter.    .  Return in about 2 weeks (around 05/28/2018) for s/p laser retinopexy OS.  There are no  Patient Instructions on file for this visit.   Explained the diagnoses, plan, and follow up with the patient and they expressed understanding.  Patient expressed understanding of the importance of proper follow up care.   This document serves as a record of services personally performed by Gardiner Sleeper, MD, PhD. It was created on their behalf by Ernest Mallick, OA, an ophthalmic assistant. The creation of this record is the provider's dictation and/or activities during the visit.    Electronically signed by: Ernest Mallick, OA  10.30.19 11:22 PM    Gardiner Sleeper, M.D., Ph.D. Diseases & Surgery of the Retina and Vitreous [facility]   I have reviewed the above documentation for accuracy and completeness, and I agree with the above. Gardiner Sleeper, M.D., Ph.D. 05/16/18 11:22 PM    Abbreviations: M myopia (nearsighted); A astigmatism; H hyperopia (farsighted); P presbyopia; Mrx spectacle prescription;  CTL contact lenses; OD right eye; OS left eye; OU both eyes  XT exotropia; ET esotropia; PEK punctate epithelial keratitis; PEE punctate epithelial erosions; DES dry eye syndrome; MGD meibomian gland dysfunction; ATs artificial tears; PFAT's preservative free artificial tears; [place] nuclear sclerotic cataract; PSC posterior subcapsular cataract; ERM epi-retinal membrane; PVD posterior vitreous detachment; RD retinal detachment; DM diabetes mellitus; DR diabetic retinopathy; NPDR non-proliferative diabetic retinopathy; PDR proliferative diabetic retinopathy; CSME clinically significant macular edema; DME diabetic macular edema; dbh dot blot hemorrhages; CWS cotton wool spot; POAG primary open angle glaucoma; C/D cup-to-disc ratio; HVF humphrey visual field; GVF goldmann visual field; OCT optical coherence tomography; IOP intraocular pressure; BRVO Branch retinal vein occlusion; CRVO central retinal vein occlusion; CRAO central retinal artery occlusion; BRAO branch retinal artery  occlusion; RT retinal tear; SB scleral buckle; PPV pars plana vitrectomy; VH Vitreous hemorrhage; PRP panretinal laser photocoagulation; IVK intravitreal kenalog; VMT vitreomacular traction; MH Macular hole;  NVD neovascularization of the disc; NVE neovascularization elsewhere; AREDS age related eye disease study; ARMD age related macular degeneration; POAG primary open angle glaucoma; EBMD epithelial/anterior basement membrane dystrophy; ACIOL anterior chamber intraocular lens; IOL intraocular lens; PCIOL posterior chamber intraocular lens; Phaco/IOL phacoemulsification with intraocular lens placement; [place] photorefractive keratectomy; LASIK laser assisted in situ keratomileusis; HTN hypertension; DM diabetes mellitus; COPD chronic obstructive pulmonary disease

## 2018-05-14 ENCOUNTER — Ambulatory Visit (INDEPENDENT_AMBULATORY_CARE_PROVIDER_SITE_OTHER): Payer: BC Managed Care – PPO | Admitting: Ophthalmology

## 2018-05-14 ENCOUNTER — Encounter (INDEPENDENT_AMBULATORY_CARE_PROVIDER_SITE_OTHER): Payer: Self-pay | Admitting: Ophthalmology

## 2018-05-14 DIAGNOSIS — H3581 Retinal edema: Secondary | ICD-10-CM

## 2018-05-14 DIAGNOSIS — H5213 Myopia, bilateral: Secondary | ICD-10-CM

## 2018-05-14 DIAGNOSIS — H33323 Round hole, bilateral: Secondary | ICD-10-CM | POA: Diagnosis not present

## 2018-05-14 DIAGNOSIS — H35413 Lattice degeneration of retina, bilateral: Secondary | ICD-10-CM

## 2018-05-14 DIAGNOSIS — E109 Type 1 diabetes mellitus without complications: Secondary | ICD-10-CM

## 2018-05-16 ENCOUNTER — Encounter (INDEPENDENT_AMBULATORY_CARE_PROVIDER_SITE_OTHER): Payer: Self-pay | Admitting: Ophthalmology

## 2018-05-17 ENCOUNTER — Telehealth (INDEPENDENT_AMBULATORY_CARE_PROVIDER_SITE_OTHER): Payer: Self-pay

## 2018-05-26 NOTE — Progress Notes (Signed)
[facility] Note  05/28/2018     CHIEF COMPLAINT Patient presents for Post-op Follow-up   HISTORY OF PRESENT ILLNESS: Kristen Carrillo is a 32 y.o. female who presents to the clinic today for:   HPI    Post-op Follow-up    In both eyes.  Discomfort includes none.  Vision is stable.  I, the attending physician,  performed the HPI with the patient and updated documentation appropriately.          Comments    32 y/o female pt here for 2 wk f/u.  S/p laser retinopexy OD on 10.04.19 and s/p laser retinopexy OS on 11.01.19.  VA good OD.  VA OS has been consistently blurred since last visit.  Denies pain, floaters, but had a couple of temporal flashes OS about 5 days ago.  Uses saline soln prn OU.       Last edited by Bernarda Caffey, MD on 05/28/2018  3:01 PM. (History)    pt states her vision in her left eye has been blurry since last visit, she states reading is especially difficult, she states she tends to close her left eye when reading, she states she's seen a couple of flashes of light temporally  Referring physician: Midge Minium, MD [address] [place], [place] [address]  HISTORICAL INFORMATION:   Selected notes from the [facility] Referred by Dr. Wyatt Portela for concern of lattice degeneration OU and atrophic holes OD LEE: 09.03.19 (S. Groat) [BCVA: OD: 20/25 OS:20/25] Ocular Hx-high myopia PMH-anxiety, depression, OCD, DM1 (takes novolog)    CURRENT MEDICATIONS: No current outpatient medications on file. (Ophthalmic Drugs)   No current facility-administered medications for this visit.  (Ophthalmic Drugs)   Current Outpatient Medications (Other)  Medication Sig  . amphetamine-dextroamphetamine (ADDERALL) 20 MG tablet 2 tablets in the morning and 1 in the evening.  . Cholecalciferol (VITAMIN D3) 5000 units CAPS Take 1 capsule by mouth daily.  . Fluvoxamine Maleate 150 MG CP24 Take 2 capsules by mouth at bedtime.   Marland Kitchen  glucagon (GLUCAGON EMERGENCY) 1 MG injection Glucagon Emergency Kit (human-recomb) 1 mg solution for injection  . glucose blood (CONTOUR NEXT TEST) test strip Use as instructed to test 3 times daily  . hydrocortisone 2.5 % cream Apply 1 application topically daily as needed. Dry skin on eye lids.  . Insulin Aspart, w/Niacinamide, (FIASP) 100 UNIT/ML SOLN Inject 80 Units into the skin daily. Use in insulin pump  . Insulin Aspart, w/Niacinamide, (FIASP) 100 UNIT/ML SOLN as directed.  Marland Kitchen LORazepam (ATIVAN) 0.5 MG tablet Take 0.5 mg by mouth daily as needed for anxiety.  Marland Kitchen NOVOLOG 100 UNIT/ML injection FOR USE IN INSULIN PUMP - USE UP TO 80 UNITS  . NUVARING 0.12-0.015 MG/24HR vaginal ring INSERT 1 RING VAGINALLY AS DIRECTED. REMOVE AFTER 3 WEEKS & WAIT 7 DAYS BEFORE INSERTING A NEW RING  . ondansetron (ZOFRAN) 4 MG tablet Take 1 tablet (4 mg total) by mouth every 8 (eight) hours as needed for nausea or vomiting.  . ondansetron (ZOFRAN) 4 MG tablet Take 4 mg by mouth as directed.  . Transparent Dressings (TRANSPARENT I.V. SITE DRESSING) MISC Use before inserting infusion set. Apply 1 dressing every 2 days  . Vitamin D, Ergocalciferol, (DRISDOL) 50000 units CAPS capsule Take 1 capsule (50,000 Units total) by mouth every 7 (seven) days.   No current facility-administered medications for this visit.  (Other)      REVIEW OF SYSTEMS:  ROS    Positive for: Endocrine, Eyes   Negative for: Constitutional, Gastrointestinal, Neurological, Skin, Genitourinary, Musculoskeletal, Cardiovascular, Respiratory, Psychiatric, Allergic/Imm, Heme/Lymph   Last edited by Matthew Folks, COA on 05/28/2018  2:29 PM. (History)       ALLERGIES Allergies  Allergen Reactions  . Adhesive [Tape] Rash  . Colophony [Pinus Strobus] Hives and Rash  . Formaldehyde Hives and Rash    Like a burn  . Latex Rash  . Nickel Hives and Rash    PAST MEDICAL HISTORY Past Medical History:  Diagnosis Date  . Anxiety   .  Depression   . Diabetes mellitus without complication [facility])    DMT1 age 56 Dr. Benjiman Core Velora Heckler)  . Pneumonia    2005   Past Surgical History:  Procedure Laterality Date  . CERVICAL POLYPECTOMY N/A 04/25/2016   Procedure: CERVICAL POLYPECTOMY;  Surgeon: Bobbye Charleston, MD;  Location: [facility];  Service: Gynecology;  Laterality: N/A;  . HYSTEROSCOPY W/D&C N/A 04/25/2016   Procedure: DILATATION AND CURETTAGE /HYSTEROSCOPY;  Surgeon: Bobbye Charleston, MD;  Location: [facility];  Service: Gynecology;  Laterality: N/A;  . MULTIPLE TOOTH EXTRACTIONS    . TYMPANOSTOMY TUBE PLACEMENT  age 85    FAMILY HISTORY Family History  Problem Relation Age of Onset  . Diabetes Mother   . Hypertension Mother   . Hyperlipidemia Mother   . Thyroid disease Mother   . Heart disease Mother   . Diabetes Maternal Uncle   . Hypertension Maternal Uncle   . Hypertension Maternal Grandmother   . Heart disease Maternal Grandmother   . Diabetes Maternal Grandfather   . Hypertension Maternal Grandfather   . Hyperlipidemia Maternal Grandfather     SOCIAL HISTORY Social History   Tobacco Use  . Smoking status: Former Smoker    Types: Cigarettes    Last attempt to quit: 07/14/2009    Years since quitting: 8.8  . Smokeless tobacco: Never Used  Substance Use Topics  . Alcohol use: Yes    Alcohol/week: 0.0 standard drinks    Comment: occasionally  . Drug use: No         OPHTHALMIC EXAM:  Base Eye Exam    Visual Acuity (Snellen - Linear)      Right Left   Dist cc 20/20 - 20/30 -2   Dist ph cc  20/25 +   Correction:  Glasses       Tonometry (Tonopen, 2:35 PM)      Right Left   Pressure 13 12       Pupils      Dark Light Shape React APD   Right 4 3 Round Brisk None   Left 4 3 Round Brisk None       Visual Fields (Counting fingers)      Left Right    Full Full       Extraocular Movement      Right Left    Full, Ortho Full, Ortho       Neuro/Psych    Oriented x3:  Yes    Mood/Affect:  Normal       Dilation    Both eyes:  1.0% Mydriacyl, 2.5% Phenylephrine @ 2:38 PM        Slit Lamp and Fundus Exam    Slit Lamp Exam      Right Left   Lids/Lashes Meibomian gland dysfunction Meibomian gland dysfunction   Conjunctiva/Sclera White and quiet White and quiet   Cornea 1+ Punctate epithelial erosions 1+ Punctate  epithelial erosions   Anterior Chamber Deep and quiet Deep and quiet   Iris Round and dilated Round and dilated   Lens Clear Clear   Vitreous Vitreous syneresis, Posterior vitreous detachment Vitreous syneresis       Fundus Exam      Right Left   Disc Temporal Peripapillary atrophy Pink and Sharp   C/D Ratio 0.2 0.3   Macula Good foveal reflex, Retinal pigment epithelial mottling, No heme or edema Good foveal reflex, Retinal pigment epithelial mottling, No heme or edema   Vessels Normal Normal   Periphery Attached, VR tuft at 0530 equator, Superior Lattice degeneration at ora, peripheral lattice SN quadrant, cobblestones at 0400 -- good laser surrounding all lesions Attached, Lattice degeneration from 0500 to 0600, superior lattice from 1000 to 0200 ora - good laser surrounding all lesions          IMAGING AND PROCEDURES  Imaging and Procedures for _0 @  OCT, Retina - OU - Both Eyes       Right Eye Quality was good. Central Foveal Thickness: 299. Progression has no prior data. Findings include normal foveal contour, no IRF, no SRF.   Left Eye Quality was good. Central Foveal Thickness: 307. Progression has no prior data. Findings include normal foveal contour, no IRF, no SRF.   Notes *Images captured and stored on drive  Diagnosis / Impression:  NFP, No IRF/SRF OU  Clinical management:  See below  Abbreviations: NFP - Normal foveal profile. CME - cystoid macular edema. PED - pigment epithelial detachment. IRF - intraretinal fluid. SRF - subretinal fluid. EZ - ellipsoid zone. ERM - epiretinal membrane. ORA - outer retinal  atrophy. ORT - outer retinal tubulation. SRHM - subretinal hyper-reflective material                  ASSESSMENT/PLAN:    ICD-10-CM   1. Bilateral retinal lattice degeneration H35.413   2. 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 Type 1 diabetes mellitus without retinopathy ([place]) E10.9   4. Retinal edema H35.81 OCT, Retina - OU - Both Eyes  5. 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 Lattice degeneration with atrophic retinal holes, OU -  - [id number] lattice at ora, peripheral lattice SN quadrant, VR tuft at 530 equator OD - OS: superior lattice from 1000 to 0200, lattice from 0500 to 0600 OS - S/P laser retinopexy OD (10.04.19) -- good laser surrounding lattice - S/P laser retinopexy OS (11.01.19) -- good laser in place - F/U 3 months  3. Diabetes mellitus, type 1 without retinopathy - The incidence, risk factors for progression, natural history and treatment options for diabetic retinopathy  were discussed with patient.   - The need for close monitoring of blood glucose, blood pressure, and serum lipids, avoiding cigarette or any type of tobacco, and the need for long term follow up was also discussed with patient. - f/u in 1 year, sooner prn  4. No retinal edema on exam or OCT  5. High Myopia OU-  - discussed association of myopia with lattice degen and RT/RD - doing well with current specs - under the expert care of Dr. Katy Fitch  Ophthalmic Meds Ordered this visit:  No orders of the defined types were placed in this encounter.    .  Return in about 3 months (around 08/28/2018) for F/U lattice, DFE, OCT.  There are no Patient Instructions on file for this visit.   Explained the diagnoses, plan, and follow up with the patient  and they expressed understanding.  Patient expressed understanding of the importance of proper follow up care.   This document serves as a record of services personally performed by Gardiner Sleeper, MD, PhD. It was created on their behalf by  Ernest Mallick, OA, an ophthalmic assistant. The creation of this record is the provider's dictation and/or activities during the visit.    Electronically signed by: Ernest Mallick, OA  11.13.19 8:24 AM     Gardiner Sleeper, M.D., Ph.D. Diseases & Surgery of the Retina and Vitreous [facility]   I have reviewed the above documentation for accuracy and completeness, and I agree with the above. Gardiner Sleeper, M.D., Ph.D. 05/31/18 8:26 AM    Abbreviations: M myopia (nearsighted); A astigmatism; H hyperopia (farsighted); P presbyopia; Mrx spectacle prescription;  CTL contact lenses; OD right eye; OS left eye; OU both eyes  XT exotropia; ET esotropia; PEK punctate epithelial keratitis; PEE punctate epithelial erosions; DES dry eye syndrome; MGD meibomian gland dysfunction; ATs artificial tears; PFAT's preservative free artificial tears; [place] nuclear sclerotic cataract; PSC posterior subcapsular cataract; ERM epi-retinal membrane; PVD posterior vitreous detachment; RD retinal detachment; DM diabetes mellitus; DR diabetic retinopathy; NPDR non-proliferative diabetic retinopathy; PDR proliferative diabetic retinopathy; CSME clinically significant macular edema; DME diabetic macular edema; dbh dot blot hemorrhages; CWS cotton wool spot; POAG primary open angle glaucoma; C/D cup-to-disc ratio; HVF humphrey visual field; GVF goldmann visual field; OCT optical coherence tomography; IOP intraocular pressure; BRVO Branch retinal vein occlusion; CRVO central retinal vein occlusion; CRAO central retinal artery occlusion; BRAO branch retinal artery occlusion; RT retinal tear; SB scleral buckle; PPV pars plana vitrectomy; VH Vitreous hemorrhage; PRP panretinal laser photocoagulation; IVK intravitreal kenalog; VMT vitreomacular traction; MH Macular hole;  NVD neovascularization of the disc; NVE neovascularization elsewhere; AREDS age related eye disease study; ARMD age related macular degeneration;  POAG primary open angle glaucoma; EBMD epithelial/anterior basement membrane dystrophy; ACIOL anterior chamber intraocular lens; IOL intraocular lens; PCIOL posterior chamber intraocular lens; Phaco/IOL phacoemulsification with intraocular lens placement; [place] photorefractive keratectomy; LASIK laser assisted in situ keratomileusis; HTN hypertension; DM diabetes mellitus; COPD chronic obstructive pulmonary disease

## 2018-05-28 ENCOUNTER — Encounter (INDEPENDENT_AMBULATORY_CARE_PROVIDER_SITE_OTHER): Payer: Self-pay | Admitting: Ophthalmology

## 2018-05-28 ENCOUNTER — Ambulatory Visit (INDEPENDENT_AMBULATORY_CARE_PROVIDER_SITE_OTHER): Payer: BC Managed Care – PPO | Admitting: Ophthalmology

## 2018-05-28 DIAGNOSIS — H5213 Myopia, bilateral: Secondary | ICD-10-CM

## 2018-05-28 DIAGNOSIS — H33323 Round hole, bilateral: Secondary | ICD-10-CM

## 2018-05-28 DIAGNOSIS — H3581 Retinal edema: Secondary | ICD-10-CM

## 2018-05-28 DIAGNOSIS — E109 Type 1 diabetes mellitus without complications: Secondary | ICD-10-CM

## 2018-05-28 DIAGNOSIS — H35413 Lattice degeneration of retina, bilateral: Secondary | ICD-10-CM

## 2018-05-31 ENCOUNTER — Encounter (INDEPENDENT_AMBULATORY_CARE_PROVIDER_SITE_OTHER): Payer: Self-pay | Admitting: Ophthalmology

## 2018-06-03 ENCOUNTER — Telehealth: Payer: Self-pay | Admitting: Internal Medicine

## 2018-06-03 NOTE — Telephone Encounter (Signed)
Patient has called stating she would like to get an RX for ketone pee test strips. Please Advise, thanks

## 2018-06-04 ENCOUNTER — Other Ambulatory Visit: Payer: Self-pay

## 2018-06-04 MED ORDER — URINE GLUCOSE-KETONES TEST VI STRP: ORAL_STRIP | 11 refills | Status: AC

## 2018-06-04 NOTE — Telephone Encounter (Signed)
Use 1-3x a day as needed, send 50 individually wrapped (if possible), with 11 refills

## 2018-06-04 NOTE — Telephone Encounter (Signed)
Ok

## 2018-06-04 NOTE — Telephone Encounter (Signed)
Please advise what signature would be for this

## 2018-06-04 NOTE — Telephone Encounter (Signed)
Please advise if ok to order this

## 2018-06-04 NOTE — Telephone Encounter (Signed)
This has been sent

## 2018-06-24 ENCOUNTER — Ambulatory Visit (INDEPENDENT_AMBULATORY_CARE_PROVIDER_SITE_OTHER): Payer: BC Managed Care – PPO | Admitting: Internal Medicine

## 2018-06-24 ENCOUNTER — Encounter: Payer: Self-pay | Admitting: Internal Medicine

## 2018-06-24 VITALS — BP 118/70 | HR 78 | Ht 68.0 in | Wt 187.0 lb

## 2018-06-24 DIAGNOSIS — E10649 Type 1 diabetes mellitus with hypoglycemia without coma: Secondary | ICD-10-CM

## 2018-06-24 LAB — POCT GLYCOSYLATED HEMOGLOBIN (HGB A1C): Hemoglobin A1C: 7.1 % — AB (ref 4.0–5.6)
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
## 2018-06-24 NOTE — Patient Instructions (Addendum)
Please continue: - basal rates: 12 am: 1.65 5 am:  1.8  8 am: 1.55 11 am: 1.5 8 pm: 1.6 - ICR:  12 am: 8.5 11 am: 7.5 4 pm: 7.5  9 pm: 7.5 - target: 110-110 - ISF: 50, except 60 between 12 am-4 pm - Insulin on Board: 4h  Try to attach the sensor on the leg.  Please stop at the lab.  Please return in 3 months.

## 2018-06-25 LAB — LIPID PANEL
CHOLESTEROL: 181 mg/dL (ref 0–200)
HDL: 70.4 mg/dL (ref >39.00)
LDL Cholesterol: 98 mg/dL (ref 0–99)
NonHDL: 110.1
Total CHOL/HDL Ratio: 3
Triglycerides: 62 mg/dL (ref 0.0–149.0)
VLDL: 12.4 mg/dL (ref 0.0–40.0)

## 2018-07-05 ENCOUNTER — Encounter: Payer: Self-pay | Admitting: Family Medicine

## 2018-07-05 ENCOUNTER — Ambulatory Visit (INDEPENDENT_AMBULATORY_CARE_PROVIDER_SITE_OTHER): Payer: BC Managed Care – PPO | Admitting: Family Medicine

## 2018-07-05 ENCOUNTER — Other Ambulatory Visit: Payer: Self-pay

## 2018-07-05 VITALS — BP 120/71 | HR 87 | Temp 98.1°F | Resp 16 | Ht 68.0 in | Wt 188.2 lb

## 2018-07-05 DIAGNOSIS — Z0000 Encounter for general adult medical examination without abnormal findings: Secondary | ICD-10-CM

## 2018-07-05 DIAGNOSIS — E10649 Type 1 diabetes mellitus with hypoglycemia without coma: Secondary | ICD-10-CM

## 2018-07-05 LAB — BASIC METABOLIC PANEL
BUN: 15 mg/dL (ref 6–23)
CO2: 26 mEq/L (ref 19–32)
Calcium: 8.9 mg/dL (ref 8.4–10.5)
Chloride: 102 mEq/L (ref 96–112)
Creatinine, Ser: 0.77 mg/dL (ref 0.40–1.20)
GFR: 92.73 mL/min (ref >60.00)
Glucose, Bld: 237 mg/dL — ABNORMAL HIGH (ref 70–99)
Potassium: 4.1 mEq/L (ref 3.5–5.1)
Sodium: 135 mEq/L (ref 135–145)

## 2018-07-05 LAB — CBC WITH DIFFERENTIAL/PLATELET
Basophils Absolute: 0.1 10*3/uL (ref 0.0–0.1)
Basophils Relative: 0.9 % (ref 0.0–3.0)
EOS ABS: 0.3 10*3/uL (ref 0.0–0.7)
Eosinophils Relative: 3.5 % (ref 0.0–5.0)
HCT: 43.5 % (ref 36.0–46.0)
Hemoglobin: 14.9 g/dL (ref 12.0–15.0)
LYMPHS PCT: 39.9 % (ref 12.0–46.0)
Lymphs Abs: 3.3 10*3/uL (ref 0.7–4.0)
MCHC: 34.2 g/dL (ref 30.0–36.0)
MCV: 83.8 fl (ref 78.0–100.0)
Monocytes Absolute: 0.5 10*3/uL (ref 0.1–1.0)
Monocytes Relative: 6 % (ref 3.0–12.0)
Neutro Abs: 4.2 10*3/uL (ref 1.4–7.7)
Neutrophils Relative %: 49.7 % (ref 43.0–77.0)
Platelets: 323 10*3/uL (ref 150.0–400.0)
RBC: 5.2 Mil/uL — AB (ref 3.87–5.11)
RDW: 12.4 % (ref 11.5–15.5)
WBC: 8.4 10*3/uL (ref 4.0–10.5)

## 2018-07-05 LAB — HEPATIC FUNCTION PANEL
ALK PHOS: 71 U/L (ref 39–117)
ALT: 8 U/L (ref 0–35)
AST: 11 U/L (ref 0–37)
Albumin: 3.6 g/dL (ref 3.5–5.2)
BILIRUBIN DIRECT: 0.1 mg/dL (ref 0.0–0.3)
Total Bilirubin: 0.3 mg/dL (ref 0.2–1.2)
Total Protein: 6.9 g/dL (ref 6.0–8.3)

## 2018-07-05 LAB — TSH: TSH: 1.9 u[IU]/mL (ref 0.35–4.50)

## 2018-07-05 NOTE — Progress Notes (Signed)
   Subjective:    Patient ID: Kristen Carrillo, female    DOB: 22-Jul-1985, 32 y.o.   MRN: [id number]  HPI CPE- UTD on GYN, flu shot, immunizations.   Review of Systems Patient reports no vision/ hearing changes, adenopathy,fever, weight change,  persistant/recurrent hoarseness , swallowing issues, chest pain, palpitations, edema, persistant/recurrent cough, hemoptysis, dyspnea (rest/exertional/paroxysmal nocturnal), gastrointestinal bleeding (melena, rectal bleeding), abdominal pain, significant heartburn, bowel changes, GU symptoms (dysuria, hematuria, incontinence), Gyn symptoms (abnormal  bleeding, pain),  syncope, focal weakness, memory loss, numbness & tingling, skin/hair/nail changes, abnormal bruising or bleeding, anxiety, or depression.     Objective:   Physical Exam General Appearance:    Alert, cooperative, no distress, appears stated age  Head:    Normocephalic, without obvious abnormality, atraumatic  Eyes:    PERRL, conjunctiva/corneas clear, EOM's intact, fundi    benign, both eyes  Ears:    Normal TM's and external ear canals, both ears  Nose:   Nares normal, septum midline, mucosa normal, no drainage    or sinus tenderness  Throat:   Lips, mucosa, and tongue normal; teeth and gums normal  Neck:   Supple, symmetrical, trachea midline, no adenopathy;    Thyroid: no enlargement/tenderness/nodules  Back:     Symmetric, no curvature, ROM normal, no CVA tenderness  Lungs:     Clear to auscultation bilaterally, respirations unlabored  Chest Wall:    No tenderness or deformity   Heart:    Regular rate and rhythm, S1 and S2 normal, no murmur, rub   or gallop  Breast Exam:    Deferred to GYN  Abdomen:     Soft, non-tender, bowel sounds active all four quadrants,    no masses, no organomegaly  Genitalia:    Deferred to GYN  Rectal:    Extremities:   Extremities normal, atraumatic, no cyanosis or edema  Pulses:   2+ and symmetric all extremities  Skin:   Skin color, texture,  turgor normal, no rashes or lesions  Lymph nodes:   Cervical, supraclavicular, and axillary nodes normal  Neurologic:   CNII-XII intact, normal strength, sensation and reflexes    throughout          Assessment & Plan:

## 2018-07-05 NOTE — Patient Instructions (Signed)
Follow up in 1 year or as needed We'll notify you of your lab results and make any changes if needed Continue to work on healthy diet and regular exercise- you look great! Call with any questions or concerns Happy Holidays!!! 

## 2018-07-05 NOTE — Assessment & Plan Note (Signed)
Pt's PE WNL w/ exception of being overweight.  UTD on pap, immunizations.  Check labs that were not done recently at Endo.  Anticipatory guidance provided.

## 2018-07-06 ENCOUNTER — Encounter: Payer: Self-pay | Admitting: General Practice

## 2018-07-29 ENCOUNTER — Ambulatory Visit: Payer: BC Managed Care – PPO | Admitting: Family Medicine

## 2018-07-29 ENCOUNTER — Other Ambulatory Visit: Payer: Self-pay

## 2018-07-29 ENCOUNTER — Encounter: Payer: Self-pay | Admitting: Family Medicine

## 2018-07-29 VITALS — BP 118/76 | HR 82 | Temp 98.0°F | Resp 15 | Wt 186.4 lb

## 2018-07-29 DIAGNOSIS — G4452 New daily persistent headache (NDPH): Secondary | ICD-10-CM

## 2018-07-29 MED ORDER — KETOROLAC TROMETHAMINE 60 MG/2ML IM SOLN
60.0000 mg | Freq: Once | INTRAMUSCULAR | Status: AC
  Administered 2018-07-29: 60 mg via INTRAMUSCULAR

## 2018-07-29 NOTE — Patient Instructions (Signed)
Please follow up if symptoms do not improve or as needed.    General Headache Without Cause A headache is pain or discomfort felt around the head or neck area. The specific cause of a headache may not be found. There are many causes and types of headaches. A few common ones are:  Tension headaches.  Migraine headaches.  Cluster headaches.  Chronic daily headaches. Follow these instructions at home: Watch your condition for any changes. Let your health care provider know about them. Take these steps to help with your condition: Managing pain      Take over-the-counter and prescription medicines only as told by your health care provider.  Lie down in a dark, quiet room when you have a headache.  If directed, put ice on your head and neck area: ? Put ice in a plastic bag. ? Place a towel between your skin and the bag. ? Leave the ice on for 20 minutes, 2-3 times per day.  If directed, apply heat to the affected area. Use the heat source that your health care provider recommends, such as a moist heat pack or a heating pad. ? Place a towel between your skin and the heat source. ? Leave the heat on for 20-30 minutes. ? Remove the heat if your skin turns bright red. This is especially important if you are unable to feel pain, heat, or cold. You may have a greater risk of getting burned.  Keep lights dim if bright lights bother you or make your headaches worse. Eating and drinking  Eat meals on a regular schedule.  If you drink alcohol: ? Limit how much you use to:  0-1 drink a day for women.  0-2 drinks a day for men. ? Be aware of how much alcohol is in your drink. In the [place] one drink equals one 12 oz bottle of beer (355 mL), one 5 oz glass of wine (148 mL), or one 1 oz glass of hard liquor (44 mL).  Stop drinking caffeine, or decrease the amount of caffeine you drink. General instructions   Keep a headache journal to help find out what may trigger your headaches. For  example, write down: ? What you eat and drink. ? How much sleep you get. ? Any change to your diet or medicines.  Try massage or other relaxation techniques.  Limit stress.  Sit up straight, and do not tense your muscles.  Do not use any products that contain nicotine or tobacco, such as cigarettes, e-cigarettes, and chewing tobacco. If you need help quitting, ask your health care provider.  Exercise regularly as told by your health care provider.  Sleep on a regular schedule. Get 7-9 hours of sleep each night, or the amount recommended by your health care provider.  Keep all follow-up visits as told by your health care provider. This is important. Contact a health care provider if:  Your symptoms are not helped by medicine.  You have a headache that is different from the usual headache.  You have nausea or you vomit.  You have a fever. Get help right away if:  Your headache becomes severe quickly.  Your headache gets worse after moderate to intense physical activity.  You have repeated vomiting.  You have a stiff neck.  You have a loss of vision.  You have problems with speech.  You have pain in the eye or ear.  You have muscular weakness or loss of muscle control.  You lose your balance or have  trouble walking.  You feel faint or pass out.  You have confusion.  You have a seizure. Summary  A headache is pain or discomfort felt around the head or neck area.  There are many causes and types of headaches. In some cases, the cause may not be found.  Keep a headache journal to help find out what may trigger your headaches. Watch your condition for any changes. Let your health care provider know about them.  Contact a health care provider if you have a headache that is different from the usual headache, or if your symptoms are not helped by medicine.  Get help right away if your headache becomes severe, you vomit, you have a loss of vision, you lose your  balance, or you have a seizure. This information is not intended to replace advice given to you by your health care provider. Make sure you discuss any questions you have with your health care provider. Document Released: 06/30/2005 Document Revised: 01/18/2018 Document Reviewed: 01/18/2018 Elsevier Interactive Patient Education  2019 [employer].

## 2018-07-29 NOTE — Progress Notes (Signed)
Subjective  CC:  Chief Complaint  Patient presents with  . Migraine    frontal pressure, nauseous, started Monday night    HPI: Kristen Carrillo is a 33 y.o. female who presents to the office today to address the problems listed above in the chief complaint.  33 year old well-controlled type I diabetic presents for 3 to 4-day history of persistent headache.  Started abruptly and described as frontal pressure.  Describes pain is moderate to severe.  Mildly improved with ibuprofen.  Has persisted over the course of the last 3 days coming and going.  Worse at times.  Currently a 6 out of 10.  Denies focal neurologic deficits.  No double vision, slurred speech, altered mental status or paresis.  Had mild nausea but she thinks this was more related to the pain.  No vomiting.  No photophobia.  No phonophobia.  No history of migraines.  Denies current stressors.  No fevers or neck pain.  She has had intermittent headaches but they have never lasted this long.  This headache is similar in nature. Assessment  1. New daily persistent headache      Plan   New daily headache, unspecified type: No red flags identified.  Normal neuro exam today.  Pain improved with Toradol injection.  Recommend home, rest and monitoring symptoms.  If headaches recur, return for further evaluation and treatment.  Discussed red flags.  To ER for worse headache of her life or neck pain or's fevers.  Follow up: As needed Visit date not found  No orders of the defined types were placed in this encounter.  Meds ordered this encounter  Medications  . ketorolac (TORADOL) injection 60 mg      I reviewed the patients updated PMH, FH, and SocHx.    Patient Active Problem List   Diagnosis Date Noted  . Physical exam 07/05/2018  . OCD (obsessive compulsive disorder) 10/14/2015  . Attention deficit hyperactivity disorder (ADHD) 10/14/2015  . Fatigue 10/11/2015  . Type 1 diabetes mellitus with hypoglycemia and without  coma ([place]) 09/19/2014  . DYSURIA 12/15/2007  . HEAT INTOLERANCE 12/15/2007   Current Meds  Medication Sig  . amphetamine-dextroamphetamine (ADDERALL) 20 MG tablet 2 tablets in the morning and 1 in the evening.  . Cholecalciferol (VITAMIN D3) 5000 units CAPS Take 1 capsule by mouth daily.  . Fluvoxamine Maleate 150 MG CP24 Take 2 capsules by mouth at bedtime.   Marland Kitchen glucagon (GLUCAGON EMERGENCY) 1 MG injection Glucagon Emergency Kit (human-recomb) 1 mg solution for injection  . glucose blood (CONTOUR NEXT TEST) test strip Use as instructed to test 3 times daily  . hydrocortisone 2.5 % cream Apply 1 application topically daily as needed. Dry skin on eye lids.  . Insulin Aspart, w/Niacinamide, (FIASP) 100 UNIT/ML SOLN Inject 80 Units into the skin daily. Use in insulin pump  . Insulin Aspart, w/Niacinamide, (FIASP) 100 UNIT/ML SOLN as directed.  Marland Kitchen LORazepam (ATIVAN) 0.5 MG tablet Take 0.5 mg by mouth daily as needed for anxiety.  . Methylphenidate HCl ER, PM, (JORNAY PM) 80 MG CP24 Take by mouth.  Marland Kitchen NOVOLOG 100 UNIT/ML injection FOR USE IN INSULIN PUMP - USE UP TO 80 UNITS  . NUVARING 0.12-0.015 MG/24HR vaginal ring INSERT 1 RING VAGINALLY AS DIRECTED. REMOVE AFTER 3 WEEKS & WAIT 7 DAYS BEFORE INSERTING A NEW RING  . ondansetron (ZOFRAN) 4 MG tablet Take 1 tablet (4 mg total) by mouth every 8 (eight) hours as needed for nausea or vomiting.  Marland Kitchen  ondansetron (ZOFRAN) 4 MG tablet Take 4 mg by mouth as directed.  . Transparent Dressings (TRANSPARENT I.V. SITE DRESSING) MISC Use before inserting infusion set. Apply 1 dressing every 2 days  . Urine Glucose-Ketones Test STRP Use 1-3 times daily as needed    Allergies: Patient is allergic to adhesive [tape]; colophony [pinus strobus]; formaldehyde; latex; and nickel. Family History: Patient family history includes Diabetes in her maternal grandfather, maternal uncle, and mother; Heart disease in her maternal grandmother and mother; Hyperlipidemia in her  maternal grandfather and mother; Hypertension in her maternal grandfather, maternal grandmother, maternal uncle, and mother; Thyroid disease in her mother. Social History:  Patient  reports that she quit smoking about 9 years ago. Her smoking use included cigarettes. She has never used smokeless tobacco. She reports current alcohol use. She reports that she does not use drugs.  Review of Systems: Constitutional: Negative for fever malaise or anorexia Cardiovascular: negative for chest pain Respiratory: negative for SOB or persistent cough Gastrointestinal: negative for abdominal pain  Objective  Vitals: BP 118/76   Pulse 82   Temp 98 F (36.7 C) (Oral)   Resp 15   Wt 186 lb 6.4 oz (84.6 kg)   SpO2 98%   BMI 28.34 kg/m  General: no acute distress , A&Ox3 HEENT: PEERL, conjunctiva normal, Oropharynx moist,neck is supple Cardiovascular:  RRR without murmur or gallop.  Respiratory:  Good breath sounds bilaterally, CTAB with normal respiratory effort Skin:  Warm, no rashes Neuro: Appears well, normal cranial nerves II through XII, nonfocal exam.  Toradol injection given with significant improvement in symptoms.  Patient left in good condition.   Commons side effects, risks, benefits, and alternatives for medications and treatment plan prescribed today were discussed, and the patient expressed understanding of the given instructions. Patient is instructed to call or message via MyChart if he/she has any questions or concerns regarding our treatment plan. No barriers to understanding were identified. We discussed Red Flag symptoms and signs in detail. Patient expressed understanding regarding what to do in case of urgent or emergency type symptoms.   Medication list was reconciled, printed and provided to the patient in AVS. Patient instructions and summary information was reviewed with the patient as documented in the AVS. This note was prepared with assistance of Dragon voice recognition  software. Occasional wrong-word or sound-a-like substitutions may have occurred due to the inherent limitations of voice recognition software

## 2018-08-18 NOTE — Progress Notes (Signed)
[facility] Note  08/20/2018     CHIEF COMPLAINT Patient presents for Retina Follow Up   HISTORY OF PRESENT ILLNESS: Kristen Carrillo is a 33 y.o. female who presents to the clinic today for:   HPI    Retina Follow Up    Patient presents with  Retinal Break/Detachment.  In both eyes.  This started 4 months ago.  Severity is mild.  Duration of 3 months.  Since onset it is stable.  I, the attending physician,  performed the HPI with the patient and updated documentation appropriately.          Comments    33 y/o female pt here for 3 mo f/u.  S/p laser OD 10.4.19 and s/p laser OS 11.1.19 for lattice w/holes OU.  No change in [place] OU.  Denies pain, flashes, floaters.  Has new glasses.  No gtts.       Last edited by Bernarda Caffey, MD on 08/20/2018  3:28 PM. (History)    pt states there is nothing new going on with her vision   Referring physician: Midge Minium, MD [address] [place] [address]  HISTORICAL INFORMATION:   Selected notes from the MEDICAL RECORD NUMBER Referred by Dr. Wyatt Portela for concern of lattice degeneration OU and atrophic holes OD LEE: 09.03.19 (S. Groat) [BCVA: OD: 20/25 OS:20/25] Ocular Hx-high myopia PMH-anxiety, depression, OCD, DM1 (takes novolog)    CURRENT MEDICATIONS: No current outpatient medications on file. (Ophthalmic Drugs)   No current facility-administered medications for this visit.  (Ophthalmic Drugs)   Current Outpatient Medications (Other)  Medication Sig  . amphetamine-dextroamphetamine (ADDERALL) 20 MG tablet 2 tablets in the morning and 1 in the evening.  . Cholecalciferol (VITAMIN D3) 5000 units CAPS Take 1 capsule by mouth daily.  . Fluvoxamine Maleate 150 MG CP24 Take 2 capsules by mouth at bedtime.   Marland Kitchen glucagon (GLUCAGON EMERGENCY) 1 MG injection Glucagon Emergency Kit (human-recomb) 1 mg solution for injection  . glucose blood (CONTOUR NEXT TEST) test strip Use as instructed to  test 3 times daily  . hydrocortisone 2.5 % cream Apply 1 application topically daily as needed. Dry skin on eye lids.  . Insulin Aspart, w/Niacinamide, (FIASP) 100 UNIT/ML SOLN Inject 80 Units into the skin daily. Use in insulin pump  . Insulin Aspart, w/Niacinamide, (FIASP) 100 UNIT/ML SOLN as directed.  . JORNAY PM 100 MG CP24   . LORazepam (ATIVAN) 0.5 MG tablet Take 0.5 mg by mouth daily as needed for anxiety.  Marland Kitchen LORazepam (ATIVAN) 1 MG tablet   . Methylphenidate HCl ER, PM, (JORNAY PM) 80 MG CP24 Take by mouth.  Marland Kitchen NOVOLOG 100 UNIT/ML injection FOR USE IN INSULIN PUMP - USE UP TO 80 UNITS  . NUVARING 0.12-0.015 MG/24HR vaginal ring INSERT 1 RING VAGINALLY AS DIRECTED. REMOVE AFTER 3 WEEKS & WAIT 7 DAYS BEFORE INSERTING A NEW RING  . ondansetron (ZOFRAN) 4 MG tablet Take 1 tablet (4 mg total) by mouth every 8 (eight) hours as needed for nausea or vomiting.  . ondansetron (ZOFRAN) 4 MG tablet Take 4 mg by mouth as directed.  . WELLBUTRIN XL 150 MG 24 hr tablet   . Transparent Dressings (TRANSPARENT I.V. SITE DRESSING) MISC Use before inserting infusion set. Apply 1 dressing every 2 days (Patient not taking: Reported on 08/20/2018)  . Urine Glucose-Ketones Test STRP Use 1-3 times daily as needed (Patient not taking: Reported on 08/20/2018)   No  current facility-administered medications for this visit.  (Other)      REVIEW OF SYSTEMS: ROS    Positive for: Endocrine, Eyes, Psychiatric   Negative for: Constitutional, Gastrointestinal, Neurological, Skin, Genitourinary, Musculoskeletal, HENT, Cardiovascular, Respiratory, Allergic/Imm, Heme/Lymph   Last edited by Matthew Folks, COA on 08/20/2018  2:49 PM. (History)       ALLERGIES Allergies  Allergen Reactions  . Adhesive [Tape] Rash  . Colophony [Pinus Strobus] Hives and Rash  . Formaldehyde Hives and Rash    Like a burn  . Latex Rash  . Nickel Hives and Rash    PAST MEDICAL HISTORY Past Medical History:  Diagnosis Date  .  Anxiety   . Depression   . Diabetes mellitus without complication [facility])    DMT1 age 21 Dr. Benjiman Core Velora Heckler)  . Pneumonia    2005   Past Surgical History:  Procedure Laterality Date  . CERVICAL POLYPECTOMY N/A 04/25/2016   Procedure: CERVICAL POLYPECTOMY;  Surgeon: Bobbye Charleston, MD;  Location: [facility];  Service: Gynecology;  Laterality: N/A;  . HYSTEROSCOPY W/D&C N/A 04/25/2016   Procedure: DILATATION AND CURETTAGE /HYSTEROSCOPY;  Surgeon: Bobbye Charleston, MD;  Location: [facility];  Service: Gynecology;  Laterality: N/A;  . MULTIPLE TOOTH EXTRACTIONS    . RETINAL LASER PROCEDURE    . TYMPANOSTOMY TUBE PLACEMENT  age 56    FAMILY HISTORY Family History  Problem Relation Age of Onset  . Diabetes Mother   . Hypertension Mother   . Hyperlipidemia Mother   . Thyroid disease Mother   . Heart disease Mother   . Diabetes Maternal Uncle   . Hypertension Maternal Uncle   . Hypertension Maternal Grandmother   . Heart disease Maternal Grandmother   . Diabetes Maternal Grandfather   . Hypertension Maternal Grandfather   . Hyperlipidemia Maternal Grandfather     SOCIAL HISTORY Social History   Tobacco Use  . Smoking status: Former Smoker    Types: Cigarettes    Last attempt to quit: 07/14/2009    Years since quitting: 9.1  . Smokeless tobacco: Never Used  Substance Use Topics  . Alcohol use: Yes    Alcohol/week: 0.0 standard drinks    Comment: occasionally  . Drug use: No         OPHTHALMIC EXAM:  Base Eye Exam    Visual Acuity (Snellen - Linear)      Right Left   Dist cc 20/20 20/25 -2   Dist ph cc  20/25 +2   Correction:  Glasses       Tonometry (Tonopen, 2:53 PM)      Right Left   Pressure 11 11       Pupils      Dark Light Shape React APD   Right 6 5 Round Brisk None   Left 6 5 Round Brisk None       Visual Fields (Counting fingers)      Left Right    Full Full       Extraocular Movement      Right Left    Full, Ortho Full, Ortho        Neuro/Psych    Oriented x3:  Yes   Mood/Affect:  Normal       Dilation    Both eyes:  1.0% Mydriacyl, 2.5% Phenylephrine @ 2:53 PM        Slit Lamp and Fundus Exam    Slit Lamp Exam      Right Left   Lids/Lashes Meibomian  gland dysfunction Meibomian gland dysfunction   Conjunctiva/Sclera White and quiet White and quiet   Cornea 1+ Punctate epithelial erosions 1+ Punctate epithelial erosions   Anterior Chamber Deep and quiet Deep and quiet   Iris Round and dilated Round and dilated   Lens Clear Clear   Vitreous Vitreous syneresis, Posterior vitreous detachment Vitreous syneresis       Fundus Exam      Right Left   Disc Temporal Peripapillary atrophy Pink and Sharp   C/D Ratio 0.2 0.2   Macula Good foveal reflex, Retinal pigment epithelial mottling, No heme or edema Good foveal reflex, Retinal pigment epithelial mottling, No heme or edema   Vessels Normal Normal   Periphery Attached, VR tuft at 0530 equator, Superior Lattice degeneration at ora, peripheral lattice SN quadrant, cobblestones at 0400 -- good laser from 1100 to 0130 and 0700 equator Attached, Lattice degeneration from 0500 to 0600, superior lattice from 1000 to 0200 ora - good laser surrounding all lesions, and inferiorly from 0430-0730          IMAGING AND PROCEDURES  Imaging and Procedures for _0 @  OCT, Retina - OU - Both Eyes       Right Eye Quality was good. Central Foveal Thickness: 300. Progression has been stable. Findings include normal foveal contour, no IRF, no SRF.   Left Eye Quality was good. Central Foveal Thickness: 303. Progression has been stable. Findings include normal foveal contour, no IRF, no SRF.   Notes *Images captured and stored on drive  Diagnosis / Impression:  NFP, No IRF/SRF OU  Clinical management:  See below  Abbreviations: NFP - Normal foveal profile. CME - cystoid macular edema. PED - pigment epithelial detachment. IRF - intraretinal fluid. SRF - subretinal fluid.  EZ - ellipsoid zone. ERM - epiretinal membrane. ORA - outer retinal atrophy. ORT - outer retinal tubulation. SRHM - subretinal hyper-reflective material                  ASSESSMENT/PLAN:    ICD-10-CM   1. Bilateral retinal lattice degeneration H35.413   2. 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 Type 1 diabetes mellitus without retinopathy ([place]) E10.9   4. Retinal edema H35.81 OCT, Retina - OU - Both Eyes  5. 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 Lattice degeneration with atrophic retinal holes, OU -  - HG:DJMEQAST lattice at ora, peripheral lattice SN quadrant, VR tuft at 530 equator OD - OS: superior lattice from 1000 to 0200, lattice from 0500 to 0600 OS - S/P laser retinopexy OD (10.04.19) -- good laser surrounding lattice - S/P laser retinopexy OS (11.01.19) -- good laser in place - F/U PRN -- will release back to Dr. Zenia Resides  3. Diabetes mellitus, type 1 without retinopathy - The incidence, risk factors for progression, natural history and treatment options for diabetic retinopathy  were discussed with patient.   - The need for close monitoring of blood glucose, blood pressure, and serum lipids, avoiding cigarette or any type of tobacco, and the need for long term follow up was also discussed with patient. - f/u in 1 year, sooner prn  4. No retinal edema on exam or OCT  5. High Myopia OU-  - discussed association of myopia with lattice degen and RT/RD - doing well with current specs - under the expert care of Dr. Katy Fitch  Ophthalmic Meds Ordered this visit:  No orders of the defined types were placed in this encounter.    Marland Kitchen  Return if symptoms worsen or fail to improve.  There are no Patient Instructions on file for this visit.   Explained the diagnoses, plan, and follow up with the patient and they expressed understanding.  Patient expressed understanding of the importance of proper follow up care.   This document serves as a record of services personally  performed by Gardiner Sleeper, MD, PhD. It was created on their behalf by Ernest Mallick, OA, an ophthalmic assistant. The creation of this record is the provider's dictation and/or activities during the visit.    Electronically signed by: Ernest Mallick, OA  02.05.2020 10:41 PM    Gardiner Sleeper, M.D., Ph.D. Diseases & Surgery of the Retina and Vitreous [facility] [employer]  I have reviewed the above documentation for accuracy and completeness, and I agree with the above. Gardiner Sleeper, M.D., Ph.D. 08/22/18 10:42 PM   Abbreviations: M myopia (nearsighted); A astigmatism; H hyperopia (farsighted); P presbyopia; Mrx spectacle prescription;  CTL contact lenses; OD right eye; OS left eye; OU both eyes  XT exotropia; ET esotropia; PEK punctate epithelial keratitis; PEE punctate epithelial erosions; DES dry eye syndrome; MGD meibomian gland dysfunction; ATs artificial tears; PFAT's preservative free artificial tears; [place] nuclear sclerotic cataract; PSC posterior subcapsular cataract; ERM epi-retinal membrane; PVD posterior vitreous detachment; RD retinal detachment; DM diabetes mellitus; DR diabetic retinopathy; NPDR non-proliferative diabetic retinopathy; PDR proliferative diabetic retinopathy; CSME clinically significant macular edema; DME diabetic macular edema; dbh dot blot hemorrhages; CWS cotton wool spot; POAG primary open angle glaucoma; C/D cup-to-disc ratio; HVF humphrey visual field; GVF goldmann visual field; OCT optical coherence tomography; IOP intraocular pressure; BRVO Branch retinal vein occlusion; CRVO central retinal vein occlusion; CRAO central retinal artery occlusion; BRAO branch retinal artery occlusion; RT retinal tear; SB scleral buckle; PPV pars plana vitrectomy; VH Vitreous hemorrhage; PRP panretinal laser photocoagulation; IVK intravitreal kenalog; VMT vitreomacular traction; MH Macular hole;  NVD neovascularization of the disc; NVE neovascularization elsewhere; AREDS  age related eye disease study; ARMD age related macular degeneration; POAG primary open angle glaucoma; EBMD epithelial/anterior basement membrane dystrophy; ACIOL anterior chamber intraocular lens; IOL intraocular lens; PCIOL posterior chamber intraocular lens; Phaco/IOL phacoemulsification with intraocular lens placement; [place] photorefractive keratectomy; LASIK laser assisted in situ keratomileusis; HTN hypertension; DM diabetes mellitus; COPD chronic obstructive pulmonary disease

## 2018-08-20 ENCOUNTER — Ambulatory Visit (INDEPENDENT_AMBULATORY_CARE_PROVIDER_SITE_OTHER): Payer: BC Managed Care – PPO | Admitting: Ophthalmology

## 2018-08-20 ENCOUNTER — Encounter (INDEPENDENT_AMBULATORY_CARE_PROVIDER_SITE_OTHER): Payer: Self-pay | Admitting: Ophthalmology

## 2018-08-20 DIAGNOSIS — H3581 Retinal edema: Secondary | ICD-10-CM | POA: Diagnosis not present

## 2018-08-20 DIAGNOSIS — H33323 Round hole, bilateral: Secondary | ICD-10-CM

## 2018-08-20 DIAGNOSIS — H5213 Myopia, bilateral: Secondary | ICD-10-CM

## 2018-08-20 DIAGNOSIS — E109 Type 1 diabetes mellitus without complications: Secondary | ICD-10-CM | POA: Diagnosis not present

## 2018-08-20 DIAGNOSIS — H35413 Lattice degeneration of retina, bilateral: Secondary | ICD-10-CM

## 2018-08-22 ENCOUNTER — Encounter (INDEPENDENT_AMBULATORY_CARE_PROVIDER_SITE_OTHER): Payer: Self-pay | Admitting: Ophthalmology

## 2018-08-27 ENCOUNTER — Encounter (INDEPENDENT_AMBULATORY_CARE_PROVIDER_SITE_OTHER): Payer: BC Managed Care – PPO | Admitting: Ophthalmology

## 2018-10-07 ENCOUNTER — Ambulatory Visit: Payer: BC Managed Care – PPO | Admitting: Internal Medicine

## 2018-11-26 ENCOUNTER — Ambulatory Visit: Payer: BC Managed Care – PPO | Admitting: Internal Medicine

## 2018-11-26 ENCOUNTER — Other Ambulatory Visit: Payer: Self-pay

## 2018-12-09 ENCOUNTER — Telehealth: Payer: Self-pay

## 2018-12-09 NOTE — Telephone Encounter (Signed)
Canceled 11/26/18 appt. Called to reschedule. LVM requesting returned call.

## 2018-12-20 ENCOUNTER — Encounter: Payer: BC Managed Care – PPO | Admitting: Internal Medicine

## 2018-12-20 ENCOUNTER — Other Ambulatory Visit: Payer: Self-pay

## 2018-12-20 DIAGNOSIS — E10649 Type 1 diabetes mellitus with hypoglycemia without coma: Secondary | ICD-10-CM
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
## 2018-12-20 NOTE — Patient Instructions (Addendum)
Please continue: - basal rates: 12 am: 1.65 5 am:  1.8 8 am: 1.55 11 am: 1.5 8 pm: 1.6 - ICR:  12 am: 8.5 11 am: 7.5 4 pm: 7.5  9 pm: 7.5 - target: 110-110 - ISF: 50, except 60 between 12 am-4 pm - Insulin on Board: 4h  Try to attach the sensor on the leg.  Please return in 3 months.

## 2018-12-21 ENCOUNTER — Encounter: Payer: Self-pay | Admitting: Internal Medicine

## 2019-03-09 ENCOUNTER — Other Ambulatory Visit: Payer: Self-pay

## 2019-03-09 ENCOUNTER — Encounter: Payer: Self-pay | Admitting: Physician Assistant

## 2019-03-09 ENCOUNTER — Ambulatory Visit (INDEPENDENT_AMBULATORY_CARE_PROVIDER_SITE_OTHER): Payer: BC Managed Care – PPO

## 2019-03-09 ENCOUNTER — Ambulatory Visit (INDEPENDENT_AMBULATORY_CARE_PROVIDER_SITE_OTHER): Payer: BC Managed Care – PPO | Admitting: Physician Assistant

## 2019-03-09 DIAGNOSIS — R112 Nausea with vomiting, unspecified: Secondary | ICD-10-CM

## 2019-03-09 DIAGNOSIS — E1065 Type 1 diabetes mellitus with hyperglycemia: Secondary | ICD-10-CM | POA: Diagnosis not present

## 2019-03-09 LAB — GLUCOSE, POCT (MANUAL RESULT ENTRY): POC Glucose: 261 mg/dl — AB (ref 70–99)

## 2019-03-09 NOTE — Progress Notes (Signed)
I have discussed the procedure for the virtual visit with the patient who has given consent to proceed with assessment and treatment.   Cire Deyarmin S Izel Eisenhardt, CMA     

## 2019-03-09 NOTE — Progress Notes (Signed)
Virtual Visit via Video   I connected with patient on 03/09/19 at  2:00 PM EDT by a video enabled telemedicine application and verified that I am speaking with the correct person using two identifiers.  Location patient: Home Location provider: [facility], Office Persons participating in the virtual visit: Patient, Provider, [place] (Patina Moore)  I discussed the limitations of evaluation and management by telemedicine and the availability of in person appointments. The patient expressed understanding and agreed to proceed.  Subjective:   HPI:   Patient with DM I, on insulin pump, presents via [facility] today c/o nausea, vomiting, fatigue starting Saturday.  Patient endorses waking up around 5 AM having several episodes of nonbloody emesis associated with significant nausea.  Patient states she checked her blood sugar and noted it was at 550.  Patient states she instantly gave herself a correction dose of insulin.  Notes that sugar would not come down.  Patient decided to change her pump and when she did noted some of the tubing was kinked.  Has not been wearing her glucose sensor and so has been unsure of how sugars have been fluctuating.  Patient states since this time the vomiting has stopped.  Sugars have improved but are not back to her usual.  Notes sugars have been averaging between 202 150.  Had one isolated sugar of 285 this morning.  Denies any residual vomiting.  Notes some residual nausea.  Has an old prescription for Zofran which she has been taking it helps.  Denies abdominal pain, diarrhea, melena or hematochezia.  Denies fever, chills.  Denies chest pain, shortness of breath, lightheadedness or dizziness.  Denies recent travel or sick contact.  Has been social distancing during the [place] pandemic due to her immunocompromise state.  States today she just mainly feels fatigued.  Has really been trying to push fluids.  Has not reached out to her endocrinologist yet.   ROS:    See pertinent positives and negatives per HPI.  Patient Active Problem List   Diagnosis Date Noted  . Physical exam 07/05/2018  . OCD (obsessive compulsive disorder) 10/14/2015  . Attention deficit hyperactivity disorder (ADHD) 10/14/2015  . Fatigue 10/11/2015  . Type 1 diabetes mellitus with hypoglycemia and without coma ([place]) 09/19/2014  . DYSURIA 12/15/2007  . HEAT INTOLERANCE 12/15/2007    Social History   Tobacco Use  . Smoking status: Former Smoker    Types: Cigarettes    Quit date: 07/14/2009    Years since quitting: 9.6  . Smokeless tobacco: Never Used  Substance Use Topics  . Alcohol use: Yes    Alcohol/week: 0.0 standard drinks    Comment: occasionally    Current Outpatient Medications:  .  amphetamine-dextroamphetamine (ADDERALL) 20 MG tablet, 2 tablets in the morning and 1 in the evening., Disp: , Rfl: 0 .  Cholecalciferol (VITAMIN D3) 5000 units CAPS, Take 1 capsule by mouth daily., Disp: , Rfl:  .  Fluvoxamine Maleate 150 MG CP24, Take 2 capsules by mouth at bedtime. , Disp: , Rfl:  .  glucagon (GLUCAGON EMERGENCY) 1 MG injection, Glucagon Emergency Kit (human-recomb) 1 mg solution for injection, Disp: , Rfl:  .  glucose blood (CONTOUR NEXT TEST) test strip, Use as instructed to test 3 times daily, Disp: 300 each, Rfl: 1 .  hydrocortisone 2.5 % cream, Apply 1 application topically daily as needed. Dry skin on eye lids., Disp: , Rfl: 3 .  Insulin Aspart, w/Niacinamide, (FIASP) 100 UNIT/ML SOLN, Inject 80 Units into the  skin daily. Use in insulin pump, Disp: 90 mL, Rfl: 3 .  Insulin Aspart, w/Niacinamide, (FIASP) 100 UNIT/ML SOLN, as directed., Disp: , Rfl:  .  JORNAY PM 100 MG CP24, , Disp: , Rfl:  .  LORazepam (ATIVAN) 0.5 MG tablet, Take 0.5 mg by mouth daily as needed for anxiety., Disp: , Rfl:  .  LORazepam (ATIVAN) 1 MG tablet, , Disp: , Rfl:  .  Methylphenidate HCl ER, PM, (JORNAY PM) 80 MG CP24, Take by mouth., Disp: , Rfl:  .  NOVOLOG 100 UNIT/ML injection,  FOR USE IN INSULIN PUMP - USE UP TO 80 UNITS, Disp: 70 mL, Rfl: 5 .  NUVARING 0.12-0.015 MG/24HR vaginal ring, INSERT 1 RING VAGINALLY AS DIRECTED. REMOVE AFTER 3 WEEKS & WAIT 7 DAYS BEFORE INSERTING A NEW RING, Disp: , Rfl: 4 .  ondansetron (ZOFRAN) 4 MG tablet, Take 1 tablet (4 mg total) by mouth every 8 (eight) hours as needed for nausea or vomiting., Disp: 20 tablet, Rfl: 0 .  ondansetron (ZOFRAN) 4 MG tablet, Take 4 mg by mouth as directed., Disp: , Rfl:  .  Transparent Dressings (TRANSPARENT I.V. SITE DRESSING) MISC, Use before inserting infusion set. Apply 1 dressing every 2 days (Patient not taking: Reported on 08/20/2018), Disp: 50 each, Rfl: 1 .  Urine Glucose-Ketones Test STRP, Use 1-3 times daily as needed (Patient not taking: Reported on 08/20/2018), Disp: 50 each, Rfl: 11 .  WELLBUTRIN XL 150 MG 24 hr tablet, , Disp: , Rfl:   Allergies  Allergen Reactions  . Adhesive [Tape] Rash  . Colophony [Pinus Strobus] Hives and Rash  . Formaldehyde Hives and Rash    Like a burn  . Latex Rash  . Nickel Hives and Rash    Objective:   There were no vitals taken for this visit.  Patient is well-developed, well-nourished in no acute distress.  Resting comfortably at home.  Head is normocephalic, atraumatic.  No labored breathing.  Speech is clear and coherent with logical content.  Patient is alert and oriented at baseline.   Assessment and Plan:   1. Non-intractable vomiting with nausea, unspecified vomiting type 2. Type 1 diabetes mellitus with hyperglycemia (HCC) Nonbloody emesis x1 day in the setting of significantly elevated glucose.  Suspect based on history that this is due more to pump malfunction than infectious state.  No residual emesis.  No other noted fluid loss.  Some residual nausea.  Is hydrating well.  She has been encouraged to continue this.  She is to contact her endocrinologist to discuss dosing changes.  Discussed case with supervising MD who agrees patient is stable  and to come into the office for stat labs today.  No indication for ER assessment at this time.  Patient scheduled for 3:00 for nurse visit to check CBG and obtain labs to include CBC, CMP, lipase.  Strict ER precautions reviewed with patient.  Patient voiced understanding and agreement with plan.  Will talk to her personally when she comes in for lab visit, so that we can get more of a examination on her.  She is to have someone bring her to the office. Leeanne Rio, PA-C 03/09/2019

## 2019-03-10 LAB — CBC WITH DIFFERENTIAL/PLATELET
Basophils Absolute: 0.1 10*3/uL (ref 0.0–0.1)
Basophils Relative: 1.2 % (ref 0.0–3.0)
Eosinophils Absolute: 0.3 10*3/uL (ref 0.0–0.7)
Eosinophils Relative: 3.7 % (ref 0.0–5.0)
HCT: 39.7 % (ref 36.0–46.0)
Hemoglobin: 13.7 g/dL (ref 12.0–15.0)
Lymphocytes Relative: 42.6 % (ref 12.0–46.0)
Lymphs Abs: 3.4 10*3/uL (ref 0.7–4.0)
MCHC: 34.4 g/dL (ref 30.0–36.0)
MCV: 83.8 fl (ref 78.0–100.0)
Monocytes Absolute: 0.4 10*3/uL (ref 0.1–1.0)
Monocytes Relative: 5 % (ref 3.0–12.0)
Neutro Abs: 3.8 10*3/uL (ref 1.4–7.7)
Neutrophils Relative %: 47.5 % (ref 43.0–77.0)
Platelets: 312 10*3/uL (ref 150.0–400.0)
RBC: 4.74 Mil/uL (ref 3.87–5.11)
RDW: 13 % (ref 11.5–15.5)
WBC: 8 10*3/uL (ref 4.0–10.5)

## 2019-03-10 LAB — COMPREHENSIVE METABOLIC PANEL
ALT: 11 U/L (ref 0–35)
AST: 10 U/L (ref 0–37)
Albumin: 3.4 g/dL — ABNORMAL LOW (ref 3.5–5.2)
Alkaline Phosphatase: 65 U/L (ref 39–117)
BUN: 13 mg/dL (ref 6–23)
CO2: 27 mEq/L (ref 19–32)
Calcium: 8.7 mg/dL (ref 8.4–10.5)
Chloride: 102 mEq/L (ref 96–112)
Creatinine, Ser: 0.64 mg/dL (ref 0.40–1.20)
GFR: 107.53 mL/min (ref >60.00)
Glucose, Bld: 229 mg/dL — ABNORMAL HIGH (ref 70–99)
Potassium: 3.8 mEq/L (ref 3.5–5.1)
Sodium: 137 mEq/L (ref 135–145)
Total Bilirubin: 0.2 mg/dL (ref 0.2–1.2)
Total Protein: 6.1 g/dL (ref 6.0–8.3)

## 2019-03-10 LAB — LIPASE: Lipase: 10 U/L — ABNORMAL LOW (ref 11.0–59.0)

## 2019-04-18 ENCOUNTER — Other Ambulatory Visit: Payer: Self-pay

## 2019-04-19 ENCOUNTER — Encounter: Payer: Self-pay | Admitting: Internal Medicine

## 2019-04-19 ENCOUNTER — Ambulatory Visit: Payer: BC Managed Care – PPO | Admitting: Internal Medicine

## 2019-04-19 VITALS — BP 110/68 | HR 132 | Ht 68.0 in | Wt 209.8 lb

## 2019-04-19 DIAGNOSIS — Z23 Encounter for immunization: Secondary | ICD-10-CM | POA: Diagnosis not present

## 2019-04-19 DIAGNOSIS — E10649 Type 1 diabetes mellitus with hypoglycemia without coma: Secondary | ICD-10-CM

## 2019-04-19 LAB — LIPID PANEL
Cholesterol: 261 mg/dL — ABNORMAL HIGH (ref 0–200)
HDL: 93 mg/dL (ref >39.00)
LDL Cholesterol: 151 mg/dL — ABNORMAL HIGH (ref 0–99)
NonHDL: 167.66
Total CHOL/HDL Ratio: 3
Triglycerides: 81 mg/dL (ref 0.0–149.0)
VLDL: 16.2 mg/dL (ref 0.0–40.0)

## 2019-04-19 LAB — POCT GLYCOSYLATED HEMOGLOBIN (HGB A1C): Hemoglobin A1C: 7.6 % — AB (ref 4.0–5.6)

## 2019-04-19 LAB — MICROALBUMIN / CREATININE URINE RATIO
Creatinine,U: 107.5 mg/dL
Microalb Creat Ratio: 0.7 mg/g (ref 0.0–30.0)
Microalb, Ur: <0.7 mg/dL (ref 0.0–1.9)

## 2019-04-19 LAB — TSH: TSH: 3.1 u[IU]/mL (ref 0.35–4.50)

## 2019-04-19 MED ORDER — GLUCAGON 3 MG/DOSE NA POWD: 3.0000 mg | Freq: Once | NASAL | 11 refills | Status: DC | PRN

## 2019-04-19 MED ORDER — GLUCAGON EMERGENCY 1 MG IJ KIT: PACK | INTRAMUSCULAR | 11 refills | Status: DC

## 2019-04-19 NOTE — Patient Instructions (Signed)
Please change: - basal rates: 12 am: 1.65 >> 1.8 5 am:  1.8  8 am: 1.55 >> 1.65 11 am: 1.5 8 pm: 1.6 - ICR:  12 am: 8.5 >> 7.5 11 am: 7.5 4 pm: 7.5 >> 7.0 9 pm: 7.5  - target: 110-110 - ISF: 50 >> 40, except 60 >> 50 between 12 am-4 pm - Insulin on Board: 4h  Please stop at the lab.  Try to stay in the auto mode as much as possible.  Please return in 3-4 months.
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
## 2019-04-28 ENCOUNTER — Other Ambulatory Visit: Payer: Self-pay | Admitting: Internal Medicine

## 2019-07-04 LAB — HM DIABETES EYE EXAM

## 2019-07-11 ENCOUNTER — Ambulatory Visit (INDEPENDENT_AMBULATORY_CARE_PROVIDER_SITE_OTHER): Payer: BC Managed Care – PPO | Admitting: Family Medicine

## 2019-07-11 ENCOUNTER — Encounter: Payer: Self-pay | Admitting: Family Medicine

## 2019-07-11 ENCOUNTER — Other Ambulatory Visit: Payer: Self-pay

## 2019-07-11 DIAGNOSIS — E1069 Type 1 diabetes mellitus with other specified complication: Secondary | ICD-10-CM

## 2019-07-11 DIAGNOSIS — E785 Hyperlipidemia, unspecified: Secondary | ICD-10-CM | POA: Diagnosis not present

## 2019-07-11 DIAGNOSIS — Z0000 Encounter for general adult medical examination without abnormal findings: Secondary | ICD-10-CM

## 2019-07-11 DIAGNOSIS — E559 Vitamin D deficiency, unspecified: Secondary | ICD-10-CM | POA: Diagnosis not present

## 2019-07-11 NOTE — Progress Notes (Signed)
Virtual Visit via Video   I connected with patient on 07/11/19 at 10:30 AM EST by a video enabled telemedicine application and verified that I am speaking with the correct person using two identifiers.  Location patient: Home Location provider: [facility], Office Persons participating in the virtual visit: Patient, Provider, [place] (Jess B)  I discussed the limitations of evaluation and management by telemedicine and the availability of in person appointments. The patient expressed understanding and agreed to proceed.  Subjective:   HPI:   CPE- has appt upcoming w/ Endo and will get repeat labs at this time  ROS:  Patient reports no vision/ hearing changes, adenopathy,fever, weight change,  persistant/recurrent hoarseness , swallowing issues, chest pain, palpitations, edema, persistant/recurrent cough, hemoptysis, dyspnea (rest/exertional/paroxysmal nocturnal), gastrointestinal bleeding (melena, rectal bleeding), abdominal pain, significant heartburn, bowel changes, GU symptoms (dysuria, hematuria, incontinence), Gyn symptoms (abnormal  bleeding, pain),  syncope, focal weakness, memory loss, numbness & tingling, skin/hair/nail changes, abnormal bruising or bleeding.  + ongoing anxiety- following w/ psych   Patient Active Problem List   Diagnosis Date Noted  . Physical exam 07/05/2018  . OCD (obsessive compulsive disorder) 10/14/2015  . Attention deficit hyperactivity disorder (ADHD) 10/14/2015  . Fatigue 10/11/2015  . Type 1 diabetes mellitus with hypoglycemia and without coma ([place]) 09/19/2014  . DYSURIA 12/15/2007  . HEAT INTOLERANCE 12/15/2007    Social History   Tobacco Use  . Smoking status: Former Smoker    Types: Cigarettes    Quit date: 07/14/2009    Years since quitting: 9.9  . Smokeless tobacco: Never Used  Substance Use Topics  . Alcohol use: Yes    Alcohol/week: 0.0 standard drinks    Comment: occasionally    Current Outpatient Medications:  .   amphetamine-dextroamphetamine (ADDERALL) 20 MG tablet, 2 tablets in the morning and 1 in the evening., Disp: , Rfl: 0 .  Cholecalciferol (VITAMIN D3) 5000 units CAPS, Take 1 capsule by mouth daily., Disp: , Rfl:  .  FIASP 100 UNIT/ML SOLN, INJECT 80 UNITS INTO THE SKIN DAILY. USE IN INSULIN PUMP, Disp: 70 mL, Rfl: 5 .  Fluvoxamine Maleate 150 MG CP24, Take 2 capsules by mouth at bedtime. , Disp: , Rfl:  .  glucagon (GLUCAGON EMERGENCY) 1 MG injection, Glucagon Emergency Kit (human-recomb) 1 mg solution for injection, Disp: 1 each, Rfl: 11 .  Glucagon 3 MG/DOSE POWD, Place 3 mg into the nose once as needed for up to 1 dose., Disp: 1 each, Rfl: 11 .  glucose blood (CONTOUR NEXT TEST) test strip, Use as instructed to test 3 times daily, Disp: 300 each, Rfl: 1 .  hydrocortisone 2.5 % cream, Apply 1 application topically daily as needed. Dry skin on eye lids., Disp: , Rfl: 3 .  JORNAY PM 100 MG CP24, , Disp: , Rfl:  .  LORazepam (ATIVAN) 1 MG tablet, , Disp: , Rfl:  .  NUVARING 0.12-0.015 MG/24HR vaginal ring, INSERT 1 RING VAGINALLY AS DIRECTED. REMOVE AFTER 3 WEEKS & WAIT 7 DAYS BEFORE INSERTING A NEW RING, Disp: , Rfl: 4 .  ondansetron (ZOFRAN) 4 MG tablet, Take 1 tablet (4 mg total) by mouth every 8 (eight) hours as needed for nausea or vomiting., Disp: 20 tablet, Rfl: 0 .  Transparent Dressings (TRANSPARENT I.V. SITE DRESSING) MISC, Use before inserting infusion set. Apply 1 dressing every 2 days, Disp: 50 each, Rfl: 1 .  Urine Glucose-Ketones Test STRP, Use 1-3 times daily as needed, Disp: 50 each, Rfl: 11 .  WELLBUTRIN  XL 300 MG 24 hr tablet, , Disp: , Rfl:   Allergies  Allergen Reactions  . Adhesive [Tape] Rash  . Colophony [Pinus Strobus] Hives and Rash  . Formaldehyde Hives and Rash    Like a burn  . Latex Rash  . Nickel Hives and Rash    Objective:   There were no vitals taken for this visit. AAOx3, NAD NCAT, EOMI No obvious CN deficits Coloring WNL Pt is able to speak clearly,  coherently without shortness of breath or increased work of breathing.  Thought process is linear.  Mood is appropriate.   Assessment and Plan:   CPE- UTD on flu, Tdap, UTD on GYN.  Has Endo appt scheduled for next week, will defer labs to that appt.  Anticipatory guidance provided.    Annye Asa, MD 07/11/2019

## 2019-07-11 NOTE — Progress Notes (Signed)
I have discussed the procedure for the virtual visit with the patient who has given consent to proceed with assessment and treatment.   Pt unable to obtain vitals.  BS 224 this am.    Davis Gourd, CMA

## 2019-07-19 ENCOUNTER — Telehealth: Payer: Self-pay

## 2019-07-19 ENCOUNTER — Other Ambulatory Visit: Payer: Self-pay

## 2019-07-19 NOTE — Telephone Encounter (Signed)
LMOVM to schedule 6 month chol f/u per PCP

## 2019-07-20 ENCOUNTER — Encounter (INDEPENDENT_AMBULATORY_CARE_PROVIDER_SITE_OTHER): Payer: BC Managed Care – PPO | Admitting: Ophthalmology

## 2019-07-21 ENCOUNTER — Other Ambulatory Visit: Payer: Self-pay

## 2019-07-21 ENCOUNTER — Encounter: Payer: Self-pay | Admitting: Internal Medicine

## 2019-07-21 ENCOUNTER — Ambulatory Visit: Payer: BC Managed Care – PPO | Admitting: Internal Medicine

## 2019-07-21 VITALS — BP 140/90 | HR 114 | Ht 68.0 in | Wt 220.0 lb

## 2019-07-21 DIAGNOSIS — E785 Hyperlipidemia, unspecified: Secondary | ICD-10-CM | POA: Diagnosis not present

## 2019-07-21 DIAGNOSIS — E559 Vitamin D deficiency, unspecified: Secondary | ICD-10-CM

## 2019-07-21 DIAGNOSIS — E1069 Type 1 diabetes mellitus with other specified complication: Secondary | ICD-10-CM | POA: Diagnosis not present

## 2019-07-21 DIAGNOSIS — E10649 Type 1 diabetes mellitus with hypoglycemia without coma: Secondary | ICD-10-CM | POA: Diagnosis not present

## 2019-07-21 LAB — POCT GLYCOSYLATED HEMOGLOBIN (HGB A1C): Hemoglobin A1C: 8.1 % — AB (ref 4.0–5.6)

## 2019-07-21 NOTE — Patient Instructions (Addendum)
Please change: - basal rates: 12 am: 1.8 >> 1.9 5 am:  1.8 >> 1.9 8 am: 1.65 11 am: 1.5 4 pm: 1.5 >> 1.7 8 pm: 1.6 >> 1.7 - ICR:  12 am: 7.5 11 am: 7.5 4 pm: 7.0 9 pm: 7.5 >> 7 - target: 110-110 - ISF: 40, except 50 between 12 am-4 pm - Insulin on Board: 4h  Try to stay in the auto mode as much as possible.  Please return in 3 months.
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
## 2019-07-21 NOTE — Addendum Note (Signed)
Addended by: Adline Mango I on: 07/21/2019 04:26 PM   Modules accepted: Orders

## 2019-07-21 NOTE — Addendum Note (Signed)
Addended by: Adline Mango I on: 07/21/2019 04:21 PM   Modules accepted: Orders

## 2019-07-21 NOTE — Addendum Note (Signed)
Addended by: STONE-ELMORE, Tyrea Froberg I on: 07/21/2019 04:26 PM   Modules accepted: Orders  

## 2019-07-22 ENCOUNTER — Encounter: Payer: Self-pay | Admitting: General Practice

## 2019-07-22 LAB — HEPATIC FUNCTION PANEL
AG Ratio: 1.2 (calc) (ref 1.0–2.5)
ALT: 9 U/L (ref 6–29)
AST: 10 U/L (ref 10–30)
Albumin: 3.7 g/dL (ref 3.6–5.1)
Alkaline phosphatase (APISO): 81 U/L (ref 31–125)
Bilirubin, Direct: 0.1 mg/dL (ref 0.0–0.2)
Globulin: 3.1 g/dL (calc) (ref 1.9–3.7)
Indirect Bilirubin: 0.2 mg/dL (calc) (ref 0.2–1.2)
Total Bilirubin: 0.3 mg/dL (ref 0.2–1.2)
Total Protein: 6.8 g/dL (ref 6.1–8.1)

## 2019-07-22 LAB — CBC WITH DIFFERENTIAL/PLATELET
Absolute Monocytes: 700 cells/uL (ref 200–950)
Basophils Absolute: 82 cells/uL (ref 0–200)
Basophils Relative: 0.8 %
Eosinophils Absolute: 361 cells/uL (ref 15–500)
Eosinophils Relative: 3.5 %
HCT: 41.6 % (ref 35.0–45.0)
Hemoglobin: 14.1 g/dL (ref 11.7–15.5)
Lymphs Abs: 3100 cells/uL (ref 850–3900)
MCH: 28.2 pg (ref 27.0–33.0)
MCHC: 33.9 g/dL (ref 32.0–36.0)
MCV: 83.2 fL (ref 80.0–100.0)
MPV: 9.2 fL (ref 7.5–12.5)
Monocytes Relative: 6.8 %
Neutro Abs: 6056 cells/uL (ref 1500–7800)
Neutrophils Relative %: 58.8 %
Platelets: 331 10*3/uL (ref 140–400)
RBC: 5 10*6/uL (ref 3.80–5.10)
RDW: 12.5 % (ref 11.0–15.0)
Total Lymphocyte: 30.1 %
WBC: 10.3 10*3/uL (ref 3.8–10.8)

## 2019-07-22 LAB — BASIC METABOLIC PANEL
BUN: 14 mg/dL (ref 7–25)
CO2: 25 mmol/L (ref 20–32)
Calcium: 9.4 mg/dL (ref 8.6–10.2)
Chloride: 104 mmol/L (ref 98–110)
Creat: 0.81 mg/dL (ref 0.50–1.10)
Glucose, Bld: 201 mg/dL — ABNORMAL HIGH (ref 65–99)
Potassium: 4.3 mmol/L (ref 3.5–5.3)
Sodium: 138 mmol/L (ref 135–146)

## 2019-07-22 LAB — VITAMIN D 25 HYDROXY (VIT D DEFICIENCY, FRACTURES): Vit D, 25-Hydroxy: 39 ng/mL (ref 30–100)

## 2019-07-28 NOTE — Progress Notes (Signed)
[facility] Note  07/29/2019     CHIEF COMPLAINT Patient presents for Retina Evaluation   HISTORY OF PRESENT ILLNESS: Kristen Carrillo is a 34 y.o. female who presents to the clinic today for:   HPI    Retina Evaluation    In both eyes.  Onset: uknown.  Duration: unknown.  Associated Symptoms Negative for Flashes, Distortion, Pain, Photophobia, Trauma, Jaw Claudication, Fever, Fatigue, Floaters, Blind Spot, Redness, Glare, Scalp Tenderness, Shoulder/Hip pain and Weight Loss.  Context:  distance vision, mid-range vision and near vision.  Treatments tried include no treatments.  I, the attending physician,  performed the HPI with the patient and updated documentation appropriately.          Comments    Patient referred by Dr. Zenia Resides for possible retinal tear OD. Patient denies floaters/FOL. Was in Dr. Zenia Resides office for routine eye exam. History of laser retinopexy OU with Dr. Coralyn Pear in the past. BS was 181 this am. Last a1c was 8.2.        Last edited by Bernarda Caffey, MD on 07/29/2019  2:07 PM. (History)    pt is back on the referral of Dr. Zenia Resides for lattice degeneration, pt states she is not having any problems, no flashes, floaters or blurry vision   Referring physician: Debbra Riding, MD [address] [place],   [address]  HISTORICAL INFORMATION:   Selected notes from the MEDICAL RECORD NUMBER Referred by Dr. Wyatt Portela for concern of lattice degeneration OU and atrophic holes OD LEE: 09.03.19 (S. Groat) [BCVA: OD: 20/25 OS:20/25] Ocular Hx-high myopia PMH-anxiety, depression, OCD, DM1 (takes novolog)    CURRENT MEDICATIONS: No current outpatient medications on file. (Ophthalmic Drugs)   No current facility-administered medications for this visit. (Ophthalmic Drugs)   Current Outpatient Medications (Other)  Medication Sig  . amphetamine-dextroamphetamine (ADDERALL) 20 MG tablet 2 tablets in the morning and 1 in the  evening.  . Cholecalciferol (VITAMIN D3) 5000 units CAPS Take 1 capsule by mouth daily.  Marland Kitchen FIASP 100 UNIT/ML SOLN INJECT 80 UNITS INTO THE SKIN DAILY. USE IN INSULIN PUMP  . Fluvoxamine Maleate 150 MG CP24 Take 2 capsules by mouth at bedtime.   Marland Kitchen glucagon (GLUCAGON EMERGENCY) 1 MG injection Glucagon Emergency Kit (human-recomb) 1 mg solution for injection  . Glucagon 3 MG/DOSE POWD Place 3 mg into the nose once as needed for up to 1 dose.  Marland Kitchen glucose blood (CONTOUR NEXT TEST) test strip Use as instructed to test 3 times daily  . hydrocortisone 2.5 % cream Apply 1 application topically daily as needed. Dry skin on eye lids.  . JORNAY PM 100 MG CP24   . LORazepam (ATIVAN) 1 MG tablet   . NUVARING 0.12-0.015 MG/24HR vaginal ring INSERT 1 RING VAGINALLY AS DIRECTED. REMOVE AFTER 3 WEEKS & WAIT 7 DAYS BEFORE INSERTING A NEW RING  . ondansetron (ZOFRAN) 4 MG tablet Take 1 tablet (4 mg total) by mouth every 8 (eight) hours as needed for nausea or vomiting.  . Transparent Dressings (TRANSPARENT I.V. SITE DRESSING) MISC Use before inserting infusion set. Apply 1 dressing every 2 days  . Urine Glucose-Ketones Test STRP Use 1-3 times daily as needed  . WELLBUTRIN XL 300 MG 24 hr tablet    No current facility-administered medications for this visit. (Other)      REVIEW OF SYSTEMS: ROS    Positive for: Endocrine, Eyes, Psychiatric   Negative for: Constitutional, Gastrointestinal, Neurological,  Skin, Genitourinary, Musculoskeletal, HENT, Cardiovascular, Respiratory, Allergic/Imm, Heme/Lymph   Last edited by [name], COT on 07/29/2019  1:30 PM. (History)       ALLERGIES Allergies  Allergen Reactions  . Adhesive [Tape] Rash  . Colophony [Pinus Strobus] Hives and Rash  . Formaldehyde Hives and Rash    Like a burn  . Latex Rash  . Nickel Hives and Rash    PAST MEDICAL HISTORY Past Medical History:  Diagnosis Date  . Anxiety   . Depression   . Diabetes mellitus without complication  [facility])    DMT1 age 27 Dr. Benjiman Core Velora Heckler)  . Pneumonia    2005   Past Surgical History:  Procedure Laterality Date  . CERVICAL POLYPECTOMY N/A 04/25/2016   Procedure: CERVICAL POLYPECTOMY;  Surgeon: Bobbye Charleston, MD;  Location: [facility];  Service: Gynecology;  Laterality: N/A;  . HYSTEROSCOPY WITH D & C N/A 04/25/2016   Procedure: DILATATION AND CURETTAGE /HYSTEROSCOPY;  Surgeon: Bobbye Charleston, MD;  Location: [facility];  Service: Gynecology;  Laterality: N/A;  . MULTIPLE TOOTH EXTRACTIONS    . RETINAL LASER PROCEDURE    . TYMPANOSTOMY TUBE PLACEMENT  age 59    FAMILY HISTORY Family History  Problem Relation Age of Onset  . Diabetes Mother   . Hypertension Mother   . Hyperlipidemia Mother   . Thyroid disease Mother   . Heart disease Mother   . Diabetes Maternal Uncle   . Hypertension Maternal Uncle   . Hypertension Maternal Grandmother   . Heart disease Maternal Grandmother   . Diabetes Maternal Grandfather   . Hypertension Maternal Grandfather   . Hyperlipidemia Maternal Grandfather     SOCIAL HISTORY Social History   Tobacco Use  . Smoking status: Former Smoker    Types: Cigarettes    Quit date: 07/14/2009    Years since quitting: 10.0  . Smokeless tobacco: Never Used  Substance Use Topics  . Alcohol use: Yes    Alcohol/week: 0.0 standard drinks    Comment: occasionally  . Drug use: No         OPHTHALMIC EXAM:  Base Eye Exam    Visual Acuity (Snellen - Linear)      Right Left   Dist cc 20/20 20/25   Dist ph cc  20/20 -2   Correction: Glasses       Tonometry (Tonopen, 1:43 PM)      Right Left   Pressure 13 13       Pupils      Dark Light Shape React APD   Right 5 4 Round Brisk None   Left 5 4 Round Brisk None       Visual Fields (Counting fingers)      Left Right    Full Full       Extraocular Movement      Right Left    Full, Ortho Full, Ortho       Neuro/Psych    Oriented x3: Yes   Mood/Affect: Normal       Dilation     Both eyes: 1.0% Mydriacyl, 2.5% Phenylephrine @ 1:43 PM        Slit Lamp and Fundus Exam    Slit Lamp Exam      Right Left   Lids/Lashes Meibomian gland dysfunction Meibomian gland dysfunction   Conjunctiva/Sclera White and quiet White and quiet   Cornea 1+ Punctate epithelial erosions 1+ Punctate epithelial erosions   Anterior Chamber Deep and quiet Deep and quiet  Iris Round and dilated Round and dilated, PPM inferiorly   Lens Clear Punctate cortical changes   Vitreous Vitreous syneresis, Posterior vitreous detachment, vitreous condensations Vitreous syneresis, Posterior vitreous detachment, vitreous condensations       Fundus Exam      Right Left   Disc Pink and Sharp, trace Peripapillary atrophy Pink and Sharp   C/D Ratio 0.2 0.2   Macula Flat, Good foveal reflex, Retinal pigment epithelial mottling, No heme or edema Flat, Good foveal reflex, Retinal pigment epithelial mottling, No heme or edema   Vessels Normal Normal   Periphery Attached, VR tuft at 0530 equator, superior lattice degeneration at 1100-0130 ora, peripheral lattice SN quadrant, cobblestones at 0400 -- good laser from 1100 to 0130 and 0700 equator, new lattice with atrophic holes at 1000 -- not lasered Attached, Lattice degeneration from 0430 to 0400, superior lattice from 1000 to 0200 ora - good laser surrounding all lesions        Refraction    Wearing Rx      Sphere Cylinder Axis   Right -9.75 +3.75 103   Left -9.25 +3.00 075       Manifest Refraction      Sphere Cylinder Axis Dist VA   Right -9.75 +3.75 105 20/20   Left -9.25 +3.25 075 20/20-1          IMAGING AND PROCEDURES  Imaging and Procedures for '@TODAY'$ @  OCT, Retina - OU - Both Eyes       Right Eye Quality was good. Central Foveal Thickness: 299. Progression has been stable. Findings include normal foveal contour, no IRF, no SRF, vitreomacular adhesion .   Left Eye Quality was good. Central Foveal Thickness: 301. Progression has  been stable. Findings include normal foveal contour, no IRF, no SRF.   Notes *Images captured and stored on drive  Diagnosis / Impression:  NFP, No IRF/SRF OU  Clinical management:  See below  Abbreviations: NFP - Normal foveal profile. CME - cystoid macular edema. PED - pigment epithelial detachment. IRF - intraretinal fluid. SRF - subretinal fluid. EZ - ellipsoid zone. ERM - epiretinal membrane. ORA - outer retinal atrophy. ORT - outer retinal tubulation. SRHM - subretinal hyper-reflective material         Repair Retinal Breaks, Laser - OD - Right Eye       LASER PROCEDURE NOTE  Procedure:  Barrier laser retinopexy using laser indirect ophthalmoscope, RIGHT eye   Diagnosis:   Lattice degeneration with atrophic holes, RIGHT eye                     1000 oclock  Surgeon: Bernarda Caffey, MD, PhD  Anesthesia: Topical  Informed consent obtained, operative eye marked, and time out performed prior to initiation of laser.   Laser settings:  Lumenis [id number] laser indirect ophthalmoscope Power: 250 mW Duration: 70 msec  # spots: 217  Placement of laser: Laser was placed in three confluent rows around lattice w/ atrophic holes at 10 oclock anterior to equator with additional rows anteriorly.  Complications: None.  Patient tolerated the procedure well and received written and verbal post-procedure care information/education.                 ASSESSMENT/PLAN:    ICD-10-CM   1. Bilateral retinal lattice degeneration  H35.413 Repair Retinal Breaks, Laser - OD - Right Eye  2. Retinal holes, bilateral  H33.323 Repair Retinal Breaks, Laser - OD - Right Eye  3. 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 Retinal edema  H35.81 OCT, Retina - OU - Both Eyes  5. High myopia, bilateral  H52.13     1,2. Lattice degeneration with atrophic retinal holes, OU -   - TF:TDDUKGUR lattice at ora, peripheral lattice SN quadrant, VR tuft at 530 equator OD  - OS:  superior lattice from 1000 to 0200, lattice from 0500 to 0600 OS  - S/P laser retinopexy OD (10.04.19) -- good laser surrounding lattice  - S/P laser retinopexy OS (11.01.19) -- good laser in place  - today, new lattice with atrophic holes at 1000 OD  - recommend laser retinopexy OD today, 01.15.21  - pt wishes to proceed with laser  - RBA of procedure discussed, questions answered  - informed consent obtained and signed  - see procedure note  - start PF QID OD x7 days  - f/u 3 weeks, DFE, OCT  3. Diabetes mellitus, type 1 without retinopathy  - The incidence, risk factors for progression, natural history and treatment options for diabetic retinopathy  were discussed with patient.   -  The need for close monitoring of blood glucose, blood pressure, and serum lipids, avoiding cigarette or any type of tobacco, and the need for long term follow up was also discussed with patient.  - f/u in 1 year, sooner prn  4. No retinal edema on exam or OCT  5. High Myopia OU-   - discussed association of myopia with lattice degen and RT/RD  - doing well with current specs  - under the expert care of Dr. Katy Fitch  Ophthalmic Meds Ordered this visit:  No orders of the defined types were placed in this encounter.    .  Return in about 3 weeks (around 08/19/2019) for f/u lattice degeneration OD, DFE, OCT.  There are no Patient Instructions on file for this visit.   Explained the diagnoses, plan, and follow up with the patient and they expressed understanding.  Patient expressed understanding of the importance of proper follow up care.   This document serves as a record of services personally performed by Gardiner Sleeper, MD, PhD. It was created on their behalf by Ernest Mallick, OA, an ophthalmic assistant. The creation of this record is the provider's dictation and/or activities during the visit.    Electronically signed by: Ernest Mallick, OA 01.14.2021 1:29 AM  Gardiner Sleeper, M.D., Ph.D. Diseases &  Surgery of the Retina and Vitreous [facility]  I have reviewed the above documentation for accuracy and completeness, and I agree with the above. Gardiner Sleeper, M.D., Ph.D. 07/31/19 1:36 AM    Abbreviations: M myopia (nearsighted); A astigmatism; H hyperopia (farsighted); P presbyopia; Mrx spectacle prescription;  CTL contact lenses; OD right eye; OS left eye; OU both eyes  XT exotropia; ET esotropia; PEK punctate epithelial keratitis; PEE punctate epithelial erosions; DES dry eye syndrome; MGD meibomian gland dysfunction; ATs artificial tears; PFAT's preservative free artificial tears; [place] nuclear sclerotic cataract; PSC posterior subcapsular cataract; ERM epi-retinal membrane; PVD posterior vitreous detachment; RD retinal detachment; DM diabetes mellitus; DR diabetic retinopathy; NPDR non-proliferative diabetic retinopathy; PDR proliferative diabetic retinopathy; CSME clinically significant macular edema; DME diabetic macular edema; dbh dot blot hemorrhages; CWS cotton wool spot; POAG primary open angle glaucoma; C/D cup-to-disc ratio; HVF humphrey visual field; GVF goldmann visual field; OCT optical coherence tomography; IOP intraocular pressure; BRVO Branch retinal vein occlusion; CRVO central retinal vein occlusion; CRAO central retinal artery occlusion; BRAO  branch retinal artery occlusion; RT retinal tear; SB scleral buckle; PPV pars plana vitrectomy; VH Vitreous hemorrhage; PRP panretinal laser photocoagulation; IVK intravitreal kenalog; VMT vitreomacular traction; MH Macular hole;  NVD neovascularization of the disc; NVE neovascularization elsewhere; AREDS age related eye disease study; ARMD age related macular degeneration; POAG primary open angle glaucoma; EBMD epithelial/anterior basement membrane dystrophy; ACIOL anterior chamber intraocular lens; IOL intraocular lens; PCIOL posterior chamber intraocular lens; Phaco/IOL phacoemulsification with intraocular lens  placement; [place] photorefractive keratectomy; LASIK laser assisted in situ keratomileusis; HTN hypertension; DM diabetes mellitus; COPD chronic obstructive pulmonary disease

## 2019-07-29 ENCOUNTER — Encounter (INDEPENDENT_AMBULATORY_CARE_PROVIDER_SITE_OTHER): Payer: Self-pay | Admitting: Ophthalmology

## 2019-07-29 ENCOUNTER — Ambulatory Visit (INDEPENDENT_AMBULATORY_CARE_PROVIDER_SITE_OTHER): Payer: BC Managed Care – PPO | Admitting: Ophthalmology

## 2019-07-29 ENCOUNTER — Other Ambulatory Visit: Payer: Self-pay

## 2019-07-29 DIAGNOSIS — H35413 Lattice degeneration of retina, bilateral: Secondary | ICD-10-CM | POA: Diagnosis not present

## 2019-07-29 DIAGNOSIS — H33323 Round hole, bilateral: Secondary | ICD-10-CM

## 2019-07-29 DIAGNOSIS — E109 Type 1 diabetes mellitus without complications: Secondary | ICD-10-CM | POA: Diagnosis not present

## 2019-07-29 DIAGNOSIS — H3581 Retinal edema: Secondary | ICD-10-CM

## 2019-07-29 DIAGNOSIS — H5213 Myopia, bilateral: Secondary | ICD-10-CM

## 2019-08-17 NOTE — Progress Notes (Addendum)
[facility] Note  08/19/2019     CHIEF COMPLAINT Patient presents for Retina Follow Up   HISTORY OF PRESENT ILLNESS: Kristen Carrillo is a 34 y.o. female who presents to the clinic today for:   HPI    Retina Follow Up    Patient presents with  Retinal Break/Detachment (Lattice degeneration).  In both eyes.  Severity is moderate.  Duration of 3 weeks.  Since onset it is stable.  I, the attending physician,  performed the HPI with the patient and updated documentation appropriately.          Comments    Patient states vision the same OU. No new floaters or flashes.        Last edited by Bernarda Caffey, MD on 08/19/2019  3:48 PM. (History)    pt states she had no problems after the laser procedure at last visit  Referring physician: Midge Minium, MD [address] [place],  [place] [id number]  HISTORICAL INFORMATION:   Selected notes from the MEDICAL RECORD NUMBER Referred by Dr. Wyatt Portela for concern of lattice degeneration OU and atrophic holes OD LEE: 09.03.19 (S. Groat) [BCVA: OD: 20/25 OS:20/25] Ocular Hx-high myopia PMH-anxiety, depression, OCD, DM1 (takes novolog)    CURRENT MEDICATIONS: No current outpatient medications on file. (Ophthalmic Drugs)   No current facility-administered medications for this visit. (Ophthalmic Drugs)   Current Outpatient Medications (Other)  Medication Sig  . amphetamine-dextroamphetamine (ADDERALL) 20 MG tablet 2 tablets in the morning and 1 in the evening.  . Cholecalciferol (VITAMIN D3) 5000 units CAPS Take 1 capsule by mouth daily.  Marland Kitchen FIASP 100 UNIT/ML SOLN INJECT 80 UNITS INTO THE SKIN DAILY. USE IN INSULIN PUMP  . Fluvoxamine Maleate 150 MG CP24 Take 2 capsules by mouth at bedtime.   Marland Kitchen glucagon (GLUCAGON EMERGENCY) 1 MG injection Glucagon Emergency Kit (human-recomb) 1 mg solution for injection  . Glucagon 3 MG/DOSE POWD Place 3 mg into the nose once as needed for up to 1 dose.  Marland Kitchen glucose  blood (CONTOUR NEXT TEST) test strip Use as instructed to test 3 times daily  . hydrocortisone 2.5 % cream Apply 1 application topically daily as needed. Dry skin on eye lids.  . JORNAY PM 100 MG CP24   . LORazepam (ATIVAN) 1 MG tablet   . NUVARING 0.12-0.015 MG/24HR vaginal ring INSERT 1 RING VAGINALLY AS DIRECTED. REMOVE AFTER 3 WEEKS & WAIT 7 DAYS BEFORE INSERTING A NEW RING  . ondansetron (ZOFRAN) 4 MG tablet Take 1 tablet (4 mg total) by mouth every 8 (eight) hours as needed for nausea or vomiting.  . Transparent Dressings (TRANSPARENT I.V. SITE DRESSING) MISC Use before inserting infusion set. Apply 1 dressing every 2 days  . Urine Glucose-Ketones Test STRP Use 1-3 times daily as needed  . WELLBUTRIN XL 300 MG 24 hr tablet    No current facility-administered medications for this visit. (Other)      REVIEW OF SYSTEMS: ROS    Positive for: Endocrine, Eyes, Psychiatric   Negative for: Constitutional, Gastrointestinal, Neurological, Skin, Genitourinary, Musculoskeletal, HENT, Cardiovascular, Respiratory, Allergic/Imm, Heme/Lymph   Last edited by [name], COT on 08/19/2019  2:58 PM. (History)       ALLERGIES Allergies  Allergen Reactions  . Adhesive [Tape] Rash  . Colophony [Pinus Strobus] Hives and Rash  . Formaldehyde Hives and Rash    Like a burn  . Latex Rash  . Nickel Hives and Rash  PAST MEDICAL HISTORY Past Medical History:  Diagnosis Date  . Anxiety   . Depression   . Diabetes mellitus without complication [facility])    DMT1 age 69 Dr. Benjiman Core Velora Heckler)  . Pneumonia    2005   Past Surgical History:  Procedure Laterality Date  . CERVICAL POLYPECTOMY N/A 04/25/2016   Procedure: CERVICAL POLYPECTOMY;  Surgeon: Bobbye Charleston, MD;  Location: [facility];  Service: Gynecology;  Laterality: N/A;  . HYSTEROSCOPY WITH D & C N/A 04/25/2016   Procedure: DILATATION AND CURETTAGE /HYSTEROSCOPY;  Surgeon: Bobbye Charleston, MD;  Location: [facility];  Service:  Gynecology;  Laterality: N/A;  . MULTIPLE TOOTH EXTRACTIONS    . RETINAL LASER PROCEDURE    . TYMPANOSTOMY TUBE PLACEMENT  age 45    FAMILY HISTORY Family History  Problem Relation Age of Onset  . Diabetes Mother   . Hypertension Mother   . Hyperlipidemia Mother   . Thyroid disease Mother   . Heart disease Mother   . Diabetes Maternal Uncle   . Hypertension Maternal Uncle   . Hypertension Maternal Grandmother   . Heart disease Maternal Grandmother   . Diabetes Maternal Grandfather   . Hypertension Maternal Grandfather   . Hyperlipidemia Maternal Grandfather     SOCIAL HISTORY Social History   Tobacco Use  . Smoking status: Former Smoker    Types: Cigarettes    Quit date: 07/14/2009    Years since quitting: 10.1  . Smokeless tobacco: Never Used  Substance Use Topics  . Alcohol use: Yes    Alcohol/week: 0.0 standard drinks    Comment: occasionally  . Drug use: No         OPHTHALMIC EXAM:  Base Eye Exam    Visual Acuity (Snellen - Linear)      Right Left   Dist cc 20/20 -2 20/25   Dist ph cc  20/20   Correction: Glasses       Tonometry (Tonopen, 3:03 PM)      Right Left   Pressure 18 14       Pupils      Dark Light Shape React APD   Right 5 4 Round Brisk None   Left 5 4 Round Brisk None       Visual Fields (Counting fingers)      Left Right    Full Full       Extraocular Movement      Right Left    Full, Ortho Full, Ortho       Neuro/Psych    Oriented x3: Yes   Mood/Affect: Normal       Dilation    Both eyes: 1.0% Mydriacyl, 2.5% Phenylephrine @ 3:03 PM        Slit Lamp and Fundus Exam    Slit Lamp Exam      Right Left   Lids/Lashes Meibomian gland dysfunction Meibomian gland dysfunction   Conjunctiva/Sclera White and quiet White and quiet   Cornea 1+ Punctate epithelial erosions 1+ Punctate epithelial erosions   Anterior Chamber Deep and quiet Deep and quiet   Iris Round and dilated Round and dilated, PPM inferiorly   Lens Clear  Punctate cortical changes   Vitreous Vitreous syneresis, Posterior vitreous detachment, vitreous condensations Vitreous syneresis, Posterior vitreous detachment, vitreous condensations       Fundus Exam      Right Left   Disc Pink and Sharp, trace Peripapillary atrophy Pink and Sharp   C/D Ratio 0.2 0.2   Macula Flat, Good  foveal reflex, Retinal pigment epithelial mottling, No heme or edema Flat, Good foveal reflex, Retinal pigment epithelial mottling, No heme or edema   Vessels Normal Normal   Periphery Attached, VR tuft at 0530 equator, superior lattice degeneration at 1100-0130 ora, peripheral lattice SN quadrant, cobblestones at 0400 -- good laser from 1100 to 0130 and 0700 equator, new lattice with atrophic holes at 1000 -- great laser changes, No new RT/RD Attached, Lattice degeneration from 0430 to 0400, superior lattice from 1000 to 0200 ora - good laser surrounding all lesions        Refraction    Wearing Rx      Sphere Cylinder Axis   Right -9.75 +3.75 103   Left -9.25 +3.00 075          IMAGING AND PROCEDURES  Imaging and Procedures for '@TODAY'$ @  OCT, Retina - OU - Both Eyes       Right Eye Quality was good. Central Foveal Thickness: 294. Progression has been stable. Findings include normal foveal contour, no IRF, no SRF, vitreomacular adhesion .   Left Eye Quality was good. Central Foveal Thickness: 300. Progression has been stable. Findings include normal foveal contour, no IRF, no SRF.   Notes *Images captured and stored on drive  Diagnosis / Impression:  NFP, No IRF/SRF OU  Clinical management:  See below  Abbreviations: NFP - Normal foveal profile. CME - cystoid macular edema. PED - pigment epithelial detachment. IRF - intraretinal fluid. SRF - subretinal fluid. EZ - ellipsoid zone. ERM - epiretinal membrane. ORA - outer retinal atrophy. ORT - outer retinal tubulation. SRHM - subretinal hyper-reflective material                   ASSESSMENT/PLAN:    ICD-10-CM   1. Bilateral retinal lattice degeneration  H35.413   2. Retinal holes, bilateral  H33.323   3. Type 1 diabetes mellitus without retinopathy ([place])  E10.9   4. Retinal edema  H35.81 OCT, Retina - OU - Both Eyes  5. High myopia, bilateral  H52.13     1,2. Lattice degeneration with atrophic retinal holes, OU -   - YD:XAJOINOM lattice at ora, peripheral lattice SN quadrant, VR tuft at 530 equator OD  - new lattice with atrophic holes at 1000 OD noted 01.15.21  - OS: superior lattice from 1000 to 0200, lattice from 0500 to 0600 OS  - S/P laser retinopexy OD (10.04.19, 01.15.21) -- good laser surrounding lattice  - S/P laser retinopexy OS (11.01.19) -- good laser in place  - completed PF QID OD x7 days  - f/u 3 months, DFE, OCT  3. Diabetes mellitus, type 1 without retinopathy  - The incidence, risk factors for progression, natural history and treatment options for diabetic retinopathy  were discussed with patient.    - The need for close monitoring of blood glucose, blood pressure, and serum lipids, avoiding cigarette or any type of tobacco, and the need for long term follow up was also discussed with patient.  - f/u in 1 year, sooner prn  4. No retinal edema on exam or OCT  5. High Myopia OU-   - discussed association of myopia with lattice degen and RT/RD  - doing well with current specs  - under the expert care of Dr. Katy Fitch  Ophthalmic Meds Ordered this visit:  No orders of the defined types were placed in this encounter.    .  Return in about 3 months (around 11/16/2019) for f/u lattice degeneration OU,  DFE, OCT.  There are no Patient Instructions on file for this visit.   Explained the diagnoses, plan, and follow up with the patient and they expressed understanding.  Patient expressed understanding of the importance of proper follow up care.   This document serves as a record of services personally performed by Gardiner Sleeper, MD, PhD. It  was created on their behalf by Ernest Mallick, OA, an ophthalmic assistant. The creation of this record is the provider's dictation and/or activities during the visit.    Electronically signed by: Ernest Mallick, OA 02.03.2021 3:49 PM  Gardiner Sleeper, M.D., Ph.D. Diseases & Surgery of the Retina and Vitreous [facility]  I have reviewed the above documentation for accuracy and completeness, and I agree with the above. Gardiner Sleeper, M.D., Ph.D. 08/19/19 3:50 PM   Abbreviations: M myopia (nearsighted); A astigmatism; H hyperopia (farsighted); P presbyopia; Mrx spectacle prescription;  CTL contact lenses; OD right eye; OS left eye; OU both eyes  XT exotropia; ET esotropia; PEK punctate epithelial keratitis; PEE punctate epithelial erosions; DES dry eye syndrome; MGD meibomian gland dysfunction; ATs artificial tears; PFAT's preservative free artificial tears; [place] nuclear sclerotic cataract; PSC posterior subcapsular cataract; ERM epi-retinal membrane; PVD posterior vitreous detachment; RD retinal detachment; DM diabetes mellitus; DR diabetic retinopathy; NPDR non-proliferative diabetic retinopathy; PDR proliferative diabetic retinopathy; CSME clinically significant macular edema; DME diabetic macular edema; dbh dot blot hemorrhages; CWS cotton wool spot; POAG primary open angle glaucoma; C/D cup-to-disc ratio; HVF humphrey visual field; GVF goldmann visual field; OCT optical coherence tomography; IOP intraocular pressure; BRVO Branch retinal vein occlusion; CRVO central retinal vein occlusion; CRAO central retinal artery occlusion; BRAO branch retinal artery occlusion; RT retinal tear; SB scleral buckle; PPV pars plana vitrectomy; VH Vitreous hemorrhage; PRP panretinal laser photocoagulation; IVK intravitreal kenalog; VMT vitreomacular traction; MH Macular hole;  NVD neovascularization of the disc; NVE neovascularization elsewhere; AREDS age related eye disease study; ARMD age related  macular degeneration; POAG primary open angle glaucoma; EBMD epithelial/anterior basement membrane dystrophy; ACIOL anterior chamber intraocular lens; IOL intraocular lens; PCIOL posterior chamber intraocular lens; Phaco/IOL phacoemulsification with intraocular lens placement; [place] photorefractive keratectomy; LASIK laser assisted in situ keratomileusis; HTN hypertension; DM diabetes mellitus; COPD chronic obstructive pulmonary disease

## 2019-08-19 ENCOUNTER — Encounter (INDEPENDENT_AMBULATORY_CARE_PROVIDER_SITE_OTHER): Payer: Self-pay | Admitting: Ophthalmology

## 2019-08-19 ENCOUNTER — Ambulatory Visit (INDEPENDENT_AMBULATORY_CARE_PROVIDER_SITE_OTHER): Payer: BC Managed Care – PPO | Admitting: Ophthalmology

## 2019-08-19 DIAGNOSIS — H3581 Retinal edema: Secondary | ICD-10-CM | POA: Diagnosis not present

## 2019-08-19 DIAGNOSIS — H33323 Round hole, bilateral: Secondary | ICD-10-CM

## 2019-08-19 DIAGNOSIS — H35413 Lattice degeneration of retina, bilateral: Secondary | ICD-10-CM

## 2019-08-19 DIAGNOSIS — H5213 Myopia, bilateral: Secondary | ICD-10-CM

## 2019-08-19 DIAGNOSIS — E109 Type 1 diabetes mellitus without complications: Secondary | ICD-10-CM

## 2019-08-26 ENCOUNTER — Other Ambulatory Visit: Payer: Self-pay

## 2019-08-26 ENCOUNTER — Encounter: Payer: Self-pay | Admitting: Family Medicine

## 2019-08-26 ENCOUNTER — Ambulatory Visit (INDEPENDENT_AMBULATORY_CARE_PROVIDER_SITE_OTHER): Payer: BC Managed Care – PPO | Admitting: Family Medicine

## 2019-08-26 VITALS — Temp 98.5°F

## 2019-08-26 DIAGNOSIS — H6982 Other specified disorders of Eustachian tube, left ear: Secondary | ICD-10-CM

## 2019-08-26 NOTE — Progress Notes (Signed)
Virtual Visit via Video   I connected with patient on 08/26/19 at 11:00 AM EST by a video enabled telemedicine application and verified that I am speaking with the correct person using two identifiers.  Location patient: Home Location provider: Acupuncturist, Office Persons participating in the virtual visit: Patient, Provider, [place] (Jess B)  I discussed the limitations of evaluation and management by telemedicine and the availability of in person appointments. The patient expressed understanding and agreed to proceed.  Subjective:   HPI:   Ear pain- L ear, started Tuesday.  When she woke Tuesday ear felt 'stopped up', hearing was muffled.  Pt reports ear will 'crackle' when she eats or opens her mouth.  No fever.  No drainage from ear.  No pain w/ manipulation of pinna.  Increased nasal congestion.  ROS:   See pertinent positives and negatives per HPI.  Patient Active Problem List   Diagnosis Date Noted  . Vitamin D deficiency 07/11/2019  . Hyperlipidemia due to type 1 diabetes mellitus ([place]) 07/11/2019  . Physical exam 07/05/2018  . OCD (obsessive compulsive disorder) 10/14/2015  . Attention deficit hyperactivity disorder (ADHD) 10/14/2015  . Fatigue 10/11/2015  . Type 1 diabetes mellitus with hypoglycemia and without coma ([place]) 09/19/2014  . DYSURIA 12/15/2007  . HEAT INTOLERANCE 12/15/2007    Social History   Tobacco Use  . Smoking status: Former Smoker    Types: Cigarettes    Quit date: 07/14/2009    Years since quitting: 10.1  . Smokeless tobacco: Never Used  Substance Use Topics  . Alcohol use: Yes    Alcohol/week: 0.0 standard drinks    Comment: occasionally    Current Outpatient Medications:  .  amphetamine-dextroamphetamine (ADDERALL) 20 MG tablet, 2 tablets in the morning and 1 in the evening., Disp: , Rfl: 0 .  Cholecalciferol (VITAMIN D3) 5000 units CAPS, Take 1 capsule by mouth daily., Disp: , Rfl:  .  FIASP 100 UNIT/ML SOLN, INJECT 80 UNITS INTO  THE SKIN DAILY. USE IN INSULIN PUMP, Disp: 70 mL, Rfl: 5 .  Fluvoxamine Maleate 150 MG CP24, Take 2 capsules by mouth at bedtime. , Disp: , Rfl:  .  glucagon (GLUCAGON EMERGENCY) 1 MG injection, Glucagon Emergency Kit (human-recomb) 1 mg solution for injection, Disp: 1 each, Rfl: 11 .  Glucagon 3 MG/DOSE POWD, Place 3 mg into the nose once as needed for up to 1 dose., Disp: 1 each, Rfl: 11 .  glucose blood (CONTOUR NEXT TEST) test strip, Use as instructed to test 3 times daily, Disp: 300 each, Rfl: 1 .  hydrocortisone 2.5 % cream, Apply 1 application topically daily as needed. Dry skin on eye lids., Disp: , Rfl: 3 .  JORNAY PM 100 MG CP24, , Disp: , Rfl:  .  LORazepam (ATIVAN) 1 MG tablet, , Disp: , Rfl:  .  NUVARING 0.12-0.015 MG/24HR vaginal ring, INSERT 1 RING VAGINALLY AS DIRECTED. REMOVE AFTER 3 WEEKS & WAIT 7 DAYS BEFORE INSERTING A NEW RING, Disp: , Rfl: 4 .  ondansetron (ZOFRAN) 4 MG tablet, Take 1 tablet (4 mg total) by mouth every 8 (eight) hours as needed for nausea or vomiting., Disp: 20 tablet, Rfl: 0 .  Transparent Dressings (TRANSPARENT I.V. SITE DRESSING) MISC, Use before inserting infusion set. Apply 1 dressing every 2 days, Disp: 50 each, Rfl: 1 .  Urine Glucose-Ketones Test STRP, Use 1-3 times daily as needed, Disp: 50 each, Rfl: 11 .  WELLBUTRIN XL 300 MG 24 hr tablet, , Disp: ,  Rfl:   Allergies  Allergen Reactions  . Adhesive [Tape] Rash  . Colophony [Pinus Strobus] Hives and Rash  . Formaldehyde Hives and Rash    Like a burn  . Latex Rash  . Nickel Hives and Rash    Objective:   Temp 98.5 F (36.9 C) (Oral)  AAOx3, NAD NCAT, EOMI No obvious CN deficits Coloring WNL Pt is able to speak clearly, coherently without shortness of breath or increased work of breathing.  Thought process is linear.  Mood is appropriate.   Assessment and Plan:   Eustachian Tube Dysfunction- new.  Reviewed dx and tx w/ pt.  Start daily antihistamine and nasal steroid.  Reviewed  supportive care and red flags that should prompt return.  Pt expressed understanding and is in agreement w/ plan.    Annye Asa, MD 08/26/2019

## 2019-08-26 NOTE — Progress Notes (Signed)
I have discussed the procedure for the virtual visit with the patient who has given consent to proceed with assessment and treatment.   Pt unable to obtain vitals except for temp.   Geannie Risen, CMA

## 2019-08-31 ENCOUNTER — Other Ambulatory Visit: Payer: Self-pay | Admitting: Internal Medicine

## 2019-08-31 ENCOUNTER — Telehealth: Payer: Self-pay | Admitting: Internal Medicine

## 2019-08-31 ENCOUNTER — Encounter: Payer: Self-pay | Admitting: Internal Medicine

## 2019-08-31 MED ORDER — BASAGLAR KWIKPEN 100 UNIT/ML ~~LOC~~ SOPN: 35.0000 [IU] | PEN_INJECTOR | Freq: Every day | SUBCUTANEOUS | 3 refills | Status: DC

## 2019-08-31 MED ORDER — INSULIN PEN NEEDLE 32G X 4 MM MISC: 3 refills | Status: DC

## 2019-08-31 NOTE — Telephone Encounter (Signed)
I sent her a message with the backup insulin plan as sent prescriptions for Lantus and pen needles to her pharmacy.

## 2019-08-31 NOTE — Telephone Encounter (Signed)
Patient requests to be called at ph# [phone] re: Patient received an alert that the infusion pump supplies will be delayed due to weather. Patient cannot remember her emergency plan if she unable to get her supplies. Please call patient at the ph# listed above to advise.

## 2019-09-01 NOTE — Telephone Encounter (Signed)
Kristen Pavlov, MD to Mamie Laurel A      08/31/19 5:54 PM Dear Sharyl Nimrod, I got your message about your pump supplies being delayed due to weather. Looking at your pump settings, I think we can use the following backup plan: - Basaglar 35 units at bedtime - FiAsp - insulin to carb ratio of 1:7 For the Fiasp sliding scale, you can use: target 130, sensitivity factor 30 I will go ahead and send Basaglar to your pharmacy (I am hoping this is covered, if not, I can send any long-acting insulin that they may cover).  You can use the FiAsp vials for injections, however, if you prefer, I can also send Fiasp pens to your pharmacy.  Please let me know. Sincerely, Kristen Pavlov MD            Last read by [name] at 8:53 PM on 08/31/2019.  Medications Ordered This Encounter   Disp Refills Start End  Insulin Glargine (BASAGLAR KWIKPEN) 100 UNIT/ML SOPN 5 pen 3 08/31/2019   Inject 0.35 mLs (35 Units total) into the skin at bedtime. - Subcutaneous  Insulin Pen Needle 32G X 4 MM MISC 200 each 3 08/31/2019   Use 1-4x a day

## 2019-09-10 ENCOUNTER — Telehealth: Payer: BC Managed Care – PPO | Admitting: Physician Assistant

## 2019-09-10 DIAGNOSIS — J020 Streptococcal pharyngitis: Secondary | ICD-10-CM

## 2019-09-10 MED ORDER — AMOXICILLIN 500 MG PO CAPS: 500.0000 mg | ORAL_CAPSULE | Freq: Two times a day (BID) | ORAL | 0 refills | Status: DC

## 2019-09-10 NOTE — Progress Notes (Signed)

## 2019-09-12 ENCOUNTER — Encounter: Payer: Self-pay | Admitting: Family Medicine

## 2019-09-12 ENCOUNTER — Ambulatory Visit (INDEPENDENT_AMBULATORY_CARE_PROVIDER_SITE_OTHER): Payer: BC Managed Care – PPO | Admitting: Family Medicine

## 2019-09-12 ENCOUNTER — Other Ambulatory Visit: Payer: Self-pay

## 2019-09-12 VITALS — Temp 100.0°F

## 2019-09-12 DIAGNOSIS — K1379 Other lesions of oral mucosa: Secondary | ICD-10-CM

## 2019-09-12 DIAGNOSIS — Z20822 Contact with and (suspected) exposure to covid-19: Secondary | ICD-10-CM | POA: Diagnosis not present

## 2019-09-12 DIAGNOSIS — J029 Acute pharyngitis, unspecified: Secondary | ICD-10-CM | POA: Diagnosis not present

## 2019-09-12 MED ORDER — LIDOCAINE VISCOUS HCL 2 % MT SOLN: 5.0000 mL | Freq: Three times a day (TID) | OROMUCOSAL | 0 refills | Status: DC | PRN

## 2019-09-12 NOTE — Progress Notes (Signed)
I have discussed the procedure for the virtual visit with the patient who has given consent to proceed with assessment and treatment.   Julius Boniface L Sienna Stonehocker, CMA     

## 2019-09-12 NOTE — Progress Notes (Signed)
Virtual Visit via Video   I connected with patient on 09/12/19 at 11:30 AM EST by a video enabled telemedicine application and verified that I am speaking with the correct person using two identifiers.  Location patient: Home Location provider: Acupuncturist, Office Persons participating in the virtual visit: Patient, Provider, [place] (Jess B)  I discussed the limitations of evaluation and management by telemedicine and the availability of in person appointments. The patient expressed understanding and agreed to proceed.  Subjective:   HPI:   Sore throat- pt reports she got 1st shot on 2/24.  Next AM woke w/ fever of 101.8.  Started alternating tylenol and ibuprofen.  Developed sore throat, swollen cervical LNs.  Developed sores in her mouth- which is making it hard to eat.  Had E-visit on Saturday, dx'd w/ strep, and started on Amoxicillin.  Has had 4 doses w/o any improvement.  No sores or rashes on hands/feet.  No known sick contacts.  + HAs.  Some body aches.  No cough.  + SOB.  No N/V/D.  No changes to taste or smell.  Everyone else in home is 'fine'.  Most of her mouth sores are on inner lower lip and along side of tongue.    ROS:   See pertinent positives and negatives per HPI.  Patient Active Problem List   Diagnosis Date Noted  . Vitamin D deficiency 07/11/2019  . Hyperlipidemia due to type 1 diabetes mellitus ([place]) 07/11/2019  . Physical exam 07/05/2018  . OCD (obsessive compulsive disorder) 10/14/2015  . Attention deficit hyperactivity disorder (ADHD) 10/14/2015  . Fatigue 10/11/2015  . Type 1 diabetes mellitus with hypoglycemia and without coma ([place]) 09/19/2014  . DYSURIA 12/15/2007  . HEAT INTOLERANCE 12/15/2007    Social History   Tobacco Use  . Smoking status: Former Smoker    Types: Cigarettes    Quit date: 07/14/2009    Years since quitting: 10.1  . Smokeless tobacco: Never Used  Substance Use Topics  . Alcohol use: Yes    Alcohol/week: 0.0 standard  drinks    Comment: occasionally    Current Outpatient Medications:  .  amoxicillin (AMOXIL) 500 MG capsule, Take 1 capsule (500 mg total) by mouth 2 (two) times daily., Disp: 20 capsule, Rfl: 0 .  amphetamine-dextroamphetamine (ADDERALL) 20 MG tablet, 2 tablets in the morning and 1 in the evening., Disp: , Rfl: 0 .  Cholecalciferol (VITAMIN D3) 5000 units CAPS, Take 1 capsule by mouth daily., Disp: , Rfl:  .  FIASP 100 UNIT/ML SOLN, INJECT 80 UNITS INTO THE SKIN DAILY. USE IN INSULIN PUMP, Disp: 70 mL, Rfl: 5 .  Fluvoxamine Maleate 150 MG CP24, Take 2 capsules by mouth at bedtime. , Disp: , Rfl:  .  glucagon (GLUCAGON EMERGENCY) 1 MG injection, Glucagon Emergency Kit (human-recomb) 1 mg solution for injection, Disp: 1 each, Rfl: 11 .  Glucagon 3 MG/DOSE POWD, Place 3 mg into the nose once as needed for up to 1 dose., Disp: 1 each, Rfl: 11 .  glucose blood (CONTOUR NEXT TEST) test strip, Use as instructed to test 3 times daily, Disp: 300 each, Rfl: 1 .  hydrocortisone 2.5 % cream, Apply 1 application topically daily as needed. Dry skin on eye lids., Disp: , Rfl: 3 .  Insulin Glargine (BASAGLAR KWIKPEN) 100 UNIT/ML SOPN, Inject 0.35 mLs (35 Units total) into the skin at bedtime., Disp: 5 pen, Rfl: 3 .  Insulin Pen Needle 32G X 4 MM MISC, Use 1-4x a day, Disp:  200 each, Rfl: 3 .  JORNAY PM 100 MG CP24, , Disp: , Rfl:  .  LORazepam (ATIVAN) 1 MG tablet, , Disp: , Rfl:  .  NUVARING 0.12-0.015 MG/24HR vaginal ring, INSERT 1 RING VAGINALLY AS DIRECTED. REMOVE AFTER 3 WEEKS & WAIT 7 DAYS BEFORE INSERTING A NEW RING, Disp: , Rfl: 4 .  ondansetron (ZOFRAN) 4 MG tablet, Take 1 tablet (4 mg total) by mouth every 8 (eight) hours as needed for nausea or vomiting., Disp: 20 tablet, Rfl: 0 .  Transparent Dressings (TRANSPARENT I.V. SITE DRESSING) MISC, Use before inserting infusion set. Apply 1 dressing every 2 days, Disp: 50 each, Rfl: 1 .  Urine Glucose-Ketones Test STRP, Use 1-3 times daily as needed, Disp:  50 each, Rfl: 11 .  WELLBUTRIN XL 300 MG 24 hr tablet, , Disp: , Rfl:   Allergies  Allergen Reactions  . Adhesive [Tape] Rash  . Colophony [Pinus Strobus] Hives and Rash  . Formaldehyde Hives and Rash    Like a burn  . Latex Rash  . Nickel Hives and Rash    Objective:   Temp 100 F (37.8 C) (Oral) Comment: tylenol at 3am  AAOx3, NAD NCAT, EOMI No obvious CN deficits Pale Pt is able to speak clearly, coherently without shortness of breath or increased work of breathing.  Thought process is linear.  Mood is appropriate.   Assessment and Plan:   Suspected COVID- new.  Pt's sxs are not consistent w/ her recent vaccine considering they have been ongoing for 5 days.  Her fever, body aches, HA, sore throat, and SOB are concerning for COVID.  Explained she could have been exposed prior to receiving vaccine.  Encouraged testing ASAP.  Discussed need for fluids, rest, and symptom monitoring.  Mouth sores- new.  Discussed that this is likely viral.  No sores on hands or feet to suggest HFM.  Start viscous lidocaine for pain and encouraged increased hydration  Sore throat- at this time doubt strep as she is not improving after 2 full days of abx.  Encouraged her to finish the Amox as prescribed but suspect this is viral.  Continue tylenol/ibuprofen, fluids, and rest.  Pt expressed understanding and is in agreement w/ plan.   Mouth Sore-    Katherine Tabori, MD 09/12/2019   

## 2019-09-13 ENCOUNTER — Encounter (INDEPENDENT_AMBULATORY_CARE_PROVIDER_SITE_OTHER): Payer: Self-pay

## 2019-09-25 ENCOUNTER — Encounter (INDEPENDENT_AMBULATORY_CARE_PROVIDER_SITE_OTHER): Payer: Self-pay

## 2019-10-26 ENCOUNTER — Other Ambulatory Visit: Payer: Self-pay

## 2019-10-28 ENCOUNTER — Other Ambulatory Visit: Payer: Self-pay

## 2019-10-28 ENCOUNTER — Encounter: Payer: Self-pay | Admitting: Internal Medicine

## 2019-10-28 ENCOUNTER — Ambulatory Visit: Payer: BC Managed Care – PPO | Admitting: Internal Medicine

## 2019-10-28 VITALS — BP 118/70 | HR 120 | Ht 68.0 in | Wt 223.0 lb

## 2019-10-28 DIAGNOSIS — E1069 Type 1 diabetes mellitus with other specified complication: Secondary | ICD-10-CM | POA: Diagnosis not present

## 2019-10-28 DIAGNOSIS — E785 Hyperlipidemia, unspecified: Secondary | ICD-10-CM | POA: Diagnosis not present

## 2019-10-28 DIAGNOSIS — E10649 Type 1 diabetes mellitus with hypoglycemia without coma: Secondary | ICD-10-CM
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
## 2019-10-28 NOTE — Patient Instructions (Addendum)
Please change: - basal rates: 12 am: 1.9 >> 2.0 5 am:  1.9 >> 2.0 8 am: 1.65 11 am: 1.5 4 pm: 1.7 8 pm: 1.7 - ICR:  12 am: 7.5 >> 7.0 11 am: 7.5 >> 7.0 4 pm: 7.0 9 pm: 7.0 - target: 110-110 - ISF: 40, except 50 between 12 am-4 pm - Insulin on Board: 4h  Try to stay in the auto mode as much as possible.  Please return in 3-4 months.

## 2019-11-01 LAB — POCT GLYCOSYLATED HEMOGLOBIN (HGB A1C): Hemoglobin A1C: 7.8 % — AB (ref 4.0–5.6)

## 2019-11-01 NOTE — Addendum Note (Signed)
Addended by: Darliss Ridgel I on: 11/01/2019 07:58 AM   Modules accepted: Orders

## 2019-11-16 ENCOUNTER — Encounter (INDEPENDENT_AMBULATORY_CARE_PROVIDER_SITE_OTHER): Payer: Self-pay | Admitting: Ophthalmology

## 2019-11-16 ENCOUNTER — Ambulatory Visit (INDEPENDENT_AMBULATORY_CARE_PROVIDER_SITE_OTHER): Payer: BC Managed Care – PPO | Admitting: Ophthalmology

## 2019-11-16 DIAGNOSIS — E109 Type 1 diabetes mellitus without complications: Secondary | ICD-10-CM

## 2019-11-16 DIAGNOSIS — H5213 Myopia, bilateral: Secondary | ICD-10-CM

## 2019-11-16 DIAGNOSIS — H35413 Lattice degeneration of retina, bilateral: Secondary | ICD-10-CM | POA: Diagnosis not present

## 2019-11-16 DIAGNOSIS — H33323 Round hole, bilateral: Secondary | ICD-10-CM

## 2019-11-16 DIAGNOSIS — H3581 Retinal edema: Secondary | ICD-10-CM | POA: Diagnosis not present

## 2019-11-16 NOTE — Progress Notes (Signed)
[facility] Note  11/16/2019     CHIEF COMPLAINT Patient presents for Retina Follow Up   HISTORY OF PRESENT ILLNESS: Kristen Carrillo is a 34 y.o. female who presents to the clinic today for:   HPI    Retina Follow Up    Patient presents with  Other.  In both eyes.  This started years ago.  Severity is mild.  Duration of 3 months.  Since onset it is stable.  I, the attending physician,  performed the HPI with the patient and updated documentation appropriately.          Comments    34 y/o female pt here for 3 mo f/u for lattice OU.  S/p laser retinopexy OU.  No change in [place] OU.  Denies pain, FOL, floaters.  No gtts.       Last edited by Bernarda Caffey, MD on 11/16/2019  3:38 PM. (History)    pt states vision is doing well  Referring physician: Debbra Riding, MD [address] [place],  [place] [address]  HISTORICAL INFORMATION:   Selected notes from the MEDICAL RECORD NUMBER Referred by Dr. Wyatt Portela for concern of lattice degeneration OU and atrophic holes OD LEE: 09.03.19 (S. Groat) [BCVA: OD: 20/25 OS:20/25] Ocular Hx-high myopia PMH-anxiety, depression, OCD, DM1 (takes novolog)    CURRENT MEDICATIONS: No current outpatient medications on file. (Ophthalmic Drugs)   No current facility-administered medications for this visit. (Ophthalmic Drugs)   Current Outpatient Medications (Other)  Medication Sig  . amoxicillin (AMOXIL) 500 MG capsule Take 1 capsule (500 mg total) by mouth 2 (two) times daily.  Marland Kitchen amphetamine-dextroamphetamine (ADDERALL) 20 MG tablet 2 tablets in the morning and 1 in the evening.  . Cholecalciferol (VITAMIN D3) 5000 units CAPS Take 1 capsule by mouth daily.  Marland Kitchen FIASP 100 UNIT/ML SOLN INJECT 80 UNITS INTO THE SKIN DAILY. USE IN INSULIN PUMP  . Fluvoxamine Maleate 150 MG CP24 Take 2 capsules by mouth at bedtime.   Marland Kitchen glucagon (GLUCAGON EMERGENCY) 1 MG injection Glucagon Emergency Kit (human-recomb) 1 mg solution  for injection  . Glucagon 3 MG/DOSE POWD Place 3 mg into the nose once as needed for up to 1 dose.  Marland Kitchen glucose blood (CONTOUR NEXT TEST) test strip Use as instructed to test 3 times daily  . hydrocortisone 2.5 % cream Apply 1 application topically daily as needed. Dry skin on eye lids.  . Insulin Glargine (BASAGLAR KWIKPEN) 100 UNIT/ML SOPN Inject 0.35 mLs (35 Units total) into the skin at bedtime.  . Insulin Pen Needle 32G X 4 MM MISC Use 1-4x a day  . JORNAY PM 100 MG CP24   . lidocaine (XYLOCAINE) 2 % solution Use as directed 5 mLs in the mouth or throat 3 (three) times daily as needed for mouth pain.  Marland Kitchen LORazepam (ATIVAN) 1 MG tablet   . NUVARING 0.12-0.015 MG/24HR vaginal ring INSERT 1 RING VAGINALLY AS DIRECTED. REMOVE AFTER 3 WEEKS & WAIT 7 DAYS BEFORE INSERTING A NEW RING  . ondansetron (ZOFRAN) 4 MG tablet Take 1 tablet (4 mg total) by mouth every 8 (eight) hours as needed for nausea or vomiting.  . Transparent Dressings (TRANSPARENT I.V. SITE DRESSING) MISC Use before inserting infusion set. Apply 1 dressing every 2 days  . Urine Glucose-Ketones Test STRP Use 1-3 times daily as needed  . WELLBUTRIN XL 300 MG 24 hr tablet    No current facility-administered medications for this visit. (Other)  REVIEW OF SYSTEMS: ROS    Positive for: Eyes   Negative for: Constitutional, Gastrointestinal, Neurological, Skin, Genitourinary, Musculoskeletal, HENT, Endocrine, Cardiovascular, Respiratory, Psychiatric, Allergic/Imm, Heme/Lymph   Last edited by Matthew Folks, COA on 11/16/2019  3:03 PM. (History)       ALLERGIES Allergies  Allergen Reactions  . Adhesive [Tape] Rash  . Colophony [Pinus Strobus] Hives and Rash  . Formaldehyde Hives and Rash    Like a burn  . Latex Rash  . Nickel Hives and Rash    PAST MEDICAL HISTORY Past Medical History:  Diagnosis Date  . Anxiety   . Depression   . Diabetes mellitus without complication [facility])    DMT1 age 81 Dr. Benjiman Core  [facility])  . Pneumonia    2005   Past Surgical History:  Procedure Laterality Date  . CERVICAL POLYPECTOMY N/A 04/25/2016   Procedure: CERVICAL POLYPECTOMY;  Surgeon: Bobbye Charleston, MD;  Location: [facility];  Service: Gynecology;  Laterality: N/A;  . HYSTEROSCOPY WITH D & C N/A 04/25/2016   Procedure: DILATATION AND CURETTAGE /HYSTEROSCOPY;  Surgeon: Bobbye Charleston, MD;  Location: [facility];  Service: Gynecology;  Laterality: N/A;  . MULTIPLE TOOTH EXTRACTIONS    . RETINAL LASER PROCEDURE    . TYMPANOSTOMY TUBE PLACEMENT  age 17    FAMILY HISTORY Family History  Problem Relation Age of Onset  . Diabetes Mother   . Hypertension Mother   . Hyperlipidemia Mother   . Thyroid disease Mother   . Heart disease Mother   . Diabetes Maternal Uncle   . Hypertension Maternal Uncle   . Hypertension Maternal Grandmother   . Heart disease Maternal Grandmother   . Diabetes Maternal Grandfather   . Hypertension Maternal Grandfather   . Hyperlipidemia Maternal Grandfather     SOCIAL HISTORY Social History   Tobacco Use  . Smoking status: Former Smoker    Types: Cigarettes    Quit date: 07/14/2009    Years since quitting: 10.3  . Smokeless tobacco: Never Used  Substance Use Topics  . Alcohol use: Yes    Alcohol/week: 0.0 standard drinks    Comment: occasionally  . Drug use: No         OPHTHALMIC EXAM:  Base Eye Exam    Visual Acuity (Snellen - Linear)      Right Left   Dist cc 20/25 20/25   Dist ph cc NI NI   Correction: Glasses       Tonometry (Tonopen, 3:05 PM)      Right Left   Pressure 16 13       Pupils      Dark Light Shape React APD   Right 5 4 Round Brisk None   Left 5 4 Round Brisk None       Visual Fields (Counting fingers)      Left Right    Full Full       Extraocular Movement      Right Left    Full, Ortho Full, Ortho       Neuro/Psych    Oriented x3: Yes   Mood/Affect: Normal       Dilation    Both eyes: 1.0% Mydriacyl, 2.5% Phenylephrine  @ 3:05 PM        Slit Lamp and Fundus Exam    Slit Lamp Exam      Right Left   Lids/Lashes Meibomian gland dysfunction Meibomian gland dysfunction   Conjunctiva/Sclera White and quiet White and quiet   Cornea 1+  Punctate epithelial erosions 1+ Punctate epithelial erosions   Anterior Chamber Deep and quiet Deep and quiet   Iris Round and dilated Round and dilated, PPM inferiorly   Lens Clear Punctate cortical changes   Vitreous Vitreous syneresis, Posterior vitreous detachment, vitreous condensations Vitreous syneresis, Posterior vitreous detachment, vitreous condensations       Fundus Exam      Right Left   Disc Pink and Sharp, trace Peripapillary atrophy Pink and Sharp   C/D Ratio 0.2 0.2   Macula Flat, Good foveal reflex, Retinal pigment epithelial mottling, No heme or edema Flat, Good foveal reflex, Retinal pigment epithelial mottling, No heme or edema   Vessels Normal Normal   Periphery Attached, VR tuft at 0530 equator, superior lattice degeneration at 1100-0130 ora, peripheral lattice SN quadrant, cobblestones at 0400 -- good laser from 1100 to 0130 and 0700 equator, new lattice with atrophic holes at 1000 -- great laser changes, No new RT/RD Attached, Lattice degeneration from 0500-0700, superior lattice from 1000 to 0200 ora - good laser surrounding all lesions, No new RT/RD          IMAGING AND PROCEDURES  Imaging and Procedures for '@TODAY'$ @  OCT, Retina - OU - Both Eyes       Right Eye Quality was good. Central Foveal Thickness: 296. Progression has been stable. Findings include normal foveal contour, no IRF, no SRF, vitreomacular adhesion .   Left Eye Quality was good. Central Foveal Thickness: 303. Progression has been stable. Findings include normal foveal contour, no IRF, no SRF.   Notes *Images captured and stored on drive  Diagnosis / Impression:  NFP, No IRF/SRF OU  Clinical management:  See below  Abbreviations: NFP - Normal foveal profile. CME -  cystoid macular edema. PED - pigment epithelial detachment. IRF - intraretinal fluid. SRF - subretinal fluid. EZ - ellipsoid zone. ERM - epiretinal membrane. ORA - outer retinal atrophy. ORT - outer retinal tubulation. SRHM - subretinal hyper-reflective material                  ASSESSMENT/PLAN:    ICD-10-CM   1. Bilateral retinal lattice degeneration  H35.413   2. Retinal holes, bilateral  H33.323   3. Type 1 diabetes mellitus without retinopathy ([place])  E10.9   4. Retinal edema  H35.81 OCT, Retina - OU - Both Eyes  5. High myopia, bilateral  H52.13     1,2. Lattice degeneration with atrophic retinal holes, OU -   - SA:[id number] lattice at ora, peripheral lattice SN quadrant, VR tuft at 530 equator OD  - new lattice with atrophic holes at 1000 OD noted 01.15.21  - OS: superior lattice from 1000 to 0200, lattice from 0500 to 0700 OS  - S/P laser retinopexy OD (10.04.19, 01.15.21) -- good laser surrounding lattice  - S/P laser retinopexy OS (11.01.19) -- good laser in place  - f/u 1 year  3. Diabetes mellitus, type 1 without retinopathy  - The incidence, risk factors for progression, natural history and treatment options for diabetic retinopathy  were discussed with patient.    - The need for close monitoring of blood glucose, blood pressure, and serum lipids, avoiding cigarette or any type of tobacco, and the need for long term follow up was also discussed with patient.  - f/u in 1 year, sooner prn  4. No retinal edema on exam or OCT  5. High Myopia OU-   - discussed association of myopia with lattice degen and RT/RD  - doing  well with current specs  - under the expert care of Dr. Katy Fitch  Ophthalmic Meds Ordered this visit:  No orders of the defined types were placed in this encounter.    .  Return in about 1 year (around 11/15/2020) for f/u lattice degeneration OU, DFE, OCT.  There are no Patient Instructions on file for this visit.   Explained the diagnoses, plan,  and follow up with the patient and they expressed understanding.  Patient expressed understanding of the importance of proper follow up care.   Gardiner Sleeper, M.D., Ph.D. Diseases & Surgery of the Retina and [place] 11/16/2019   I have reviewed the above documentation for accuracy and completeness, and I agree with the above. Gardiner Sleeper, M.D., Ph.D. 11/19/19 12:52 AM    Abbreviations: M myopia (nearsighted); A astigmatism; H hyperopia (farsighted); P presbyopia; Mrx spectacle prescription;  CTL contact lenses; OD right eye; OS left eye; OU both eyes  XT exotropia; ET esotropia; PEK punctate epithelial keratitis; PEE punctate epithelial erosions; DES dry eye syndrome; MGD meibomian gland dysfunction; ATs artificial tears; PFAT's preservative free artificial tears; [place] nuclear sclerotic cataract; PSC posterior subcapsular cataract; ERM epi-retinal membrane; PVD posterior vitreous detachment; RD retinal detachment; DM diabetes mellitus; DR diabetic retinopathy; NPDR non-proliferative diabetic retinopathy; PDR proliferative diabetic retinopathy; CSME clinically significant macular edema; DME diabetic macular edema; dbh dot blot hemorrhages; CWS cotton wool spot; POAG primary open angle glaucoma; C/D cup-to-disc ratio; HVF humphrey visual field; GVF goldmann visual field; OCT optical coherence tomography; IOP intraocular pressure; BRVO Branch retinal vein occlusion; CRVO central retinal vein occlusion; CRAO central retinal artery occlusion; BRAO branch retinal artery occlusion; RT retinal tear; SB scleral buckle; PPV pars plana vitrectomy; VH Vitreous hemorrhage; PRP panretinal laser photocoagulation; IVK intravitreal kenalog; VMT vitreomacular traction; MH Macular hole;  NVD neovascularization of the disc; NVE neovascularization elsewhere; AREDS age related eye disease study; ARMD age related macular degeneration; POAG primary open angle glaucoma; EBMD  epithelial/anterior basement membrane dystrophy; ACIOL anterior chamber intraocular lens; IOL intraocular lens; PCIOL posterior chamber intraocular lens; Phaco/IOL phacoemulsification with intraocular lens placement; [place] photorefractive keratectomy; LASIK laser assisted in situ keratomileusis; HTN hypertension; DM diabetes mellitus; COPD chronic obstructive pulmonary disease

## 2020-01-30 ENCOUNTER — Other Ambulatory Visit: Payer: Self-pay

## 2020-01-30 ENCOUNTER — Encounter: Payer: Self-pay | Admitting: Internal Medicine

## 2020-01-30 ENCOUNTER — Ambulatory Visit: Payer: BC Managed Care – PPO | Admitting: Internal Medicine

## 2020-01-30 VITALS — BP 120/78 | HR 112 | Ht 68.0 in | Wt 227.0 lb

## 2020-01-30 DIAGNOSIS — E785 Hyperlipidemia, unspecified: Secondary | ICD-10-CM | POA: Diagnosis not present

## 2020-01-30 DIAGNOSIS — E1069 Type 1 diabetes mellitus with other specified complication: Secondary | ICD-10-CM

## 2020-01-30 DIAGNOSIS — E559 Vitamin D deficiency, unspecified: Secondary | ICD-10-CM | POA: Diagnosis not present

## 2020-01-30 DIAGNOSIS — E10649 Type 1 diabetes mellitus with hypoglycemia without coma: Secondary | ICD-10-CM

## 2020-01-30 LAB — POCT GLYCOSYLATED HEMOGLOBIN (HGB A1C): Hemoglobin A1C: 7.9 % — AB (ref 4.0–5.6)

## 2020-01-30 NOTE — Patient Instructions (Signed)
Please change: - basal rates: 12 am: 2.0 5 am:  2.0 8 am: 1.65 11 am: 1.7 4 pm: 1.7 8 pm: 1.7 - ICR:  12 am: 7.0 11 am: 7.0 4 pm: 7.0 9 pm: 7.0 - target: 110-110 - ISF: 40, except 50 between 12 am-4 pm  Change: - Insulin on Board: 4h >> 3.5 h  Try to stay in the auto mode as much as possible.  Try to have a set dinner and do not snack afterwards.  Please return in 3-4 months.
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
## 2020-02-16 ENCOUNTER — Ambulatory Visit: Payer: BC Managed Care – PPO | Admitting: Family Medicine

## 2020-02-16 ENCOUNTER — Other Ambulatory Visit: Payer: Self-pay

## 2020-02-16 ENCOUNTER — Encounter: Payer: Self-pay | Admitting: Family Medicine

## 2020-02-16 VITALS — BP 121/82 | HR 81 | Temp 97.7°F | Resp 16 | Ht 68.0 in | Wt 227.2 lb

## 2020-02-16 DIAGNOSIS — R05 Cough: Secondary | ICD-10-CM

## 2020-02-16 DIAGNOSIS — E1069 Type 1 diabetes mellitus with other specified complication: Secondary | ICD-10-CM | POA: Diagnosis not present

## 2020-02-16 DIAGNOSIS — R058 Other specified cough: Secondary | ICD-10-CM | POA: Insufficient documentation

## 2020-02-16 DIAGNOSIS — G5712 Meralgia paresthetica, left lower limb: Secondary | ICD-10-CM

## 2020-02-16 DIAGNOSIS — E785 Hyperlipidemia, unspecified: Secondary | ICD-10-CM

## 2020-02-16 MED ORDER — FAMOTIDINE 40 MG PO TABS
40.0000 mg | ORAL_TABLET | Freq: Every day | ORAL | 3 refills | Status: DC
Start: 2020-02-16 — End: 2020-04-23

## 2020-02-16 NOTE — Progress Notes (Signed)
   Subjective:    Patient ID: Kristen Carrillo, female    DOB: [birth year], 34 y.o.   MRN: [id number]  HPI Leg paresthesia- L leg.  This is worrisome to pt b/c of her diabetes.  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  sxs have not completely resolved but they have improved.  Has had sensation of 'something running down my leg'.  Does wear pants on hips.  Pt has gained 'a lot of weight'  Recently restarted exercising.  Hyperlipidemia- last LDL 151.  Not currently on statin.  Overdue for repeat lipids.  No CP, SOB, abd pain, N/V.  Nocturnal cough- x2 months, no relief w/ OTC antihistamine or humidifier.  Denies GERD.   Review of Systems For ROS see HPI   This visit occurred during the SARS-CoV-2 public health emergency.  Safety protocols were in place, including screening questions prior to the visit, additional usage of staff PPE, and extensive cleaning of exam room while observing appropriate contact time as indicated for disinfecting solutions.       Objective:   Physical Exam Vitals reviewed.  Constitutional:      General: She is not in acute distress.    Appearance: She is well-developed. She is obese.  HENT:     Head: Normocephalic and atraumatic.  Eyes:     Conjunctiva/sclera: Conjunctivae normal.     Pupils: Pupils are equal, round, and reactive to light.  Neck:     Thyroid: No thyromegaly.  Cardiovascular:     Rate and Rhythm: Normal rate and regular rhythm.     Pulses: Normal pulses.     Heart sounds: Normal heart sounds. No murmur heard.   Pulmonary:     Effort: Pulmonary effort is normal. No respiratory distress.     Breath sounds: Normal breath sounds.  Abdominal:     General: There is no distension.     Palpations: Abdomen is soft.     Tenderness: There is no abdominal tenderness.  Musculoskeletal:     Cervical back: Normal range of motion and neck supple.  Lymphadenopathy:     Cervical: No  cervical adenopathy.  Skin:    General: Skin is warm and dry.  Neurological:     Mental Status: She is alert and oriented to person, place, and time.     Sensory: Sensory deficit (L lateral thigh) present.  Psychiatric:        Behavior: Behavior normal.           Assessment & Plan:

## 2020-02-16 NOTE — Assessment & Plan Note (Signed)
Chronic problem.  Last LDL was 151 and pt's goal is <70 due to DM.  Check labs and determine if statin is needed

## 2020-02-16 NOTE — Assessment & Plan Note (Signed)
New.  Reviewed dx and causes w/ pt.  Suspect this is due to weight gain and wearing her pants around her hips.  Pt reassured by this information.

## 2020-02-16 NOTE — Assessment & Plan Note (Signed)
No improvement w/ daily antihistamine.  Add Famotidine and monitor for improvement.  If no improvement, may need pulmonary referral to assess cough.

## 2020-02-16 NOTE — Patient Instructions (Signed)
Schedule your complete physical in 3-4 months We'll notify you of your lab results and make any changes if needed START the Famotidine nightly to help w/ cough The numbness in the leg is called Meralgia Paresthetica and is due to nerve compression Continue to work on healthy diet and regular exercise- you can do it! Call with any questions or concerns Stay Safe!  Stay Healthy!

## 2020-02-17 LAB — LIPID PANEL
Cholesterol: 220 mg/dL — ABNORMAL HIGH (ref 0–200)
HDL: 74.2 mg/dL (ref >39.00)
LDL Cholesterol: 127 mg/dL — ABNORMAL HIGH (ref 0–99)
NonHDL: 145.44
Total CHOL/HDL Ratio: 3
Triglycerides: 90 mg/dL (ref 0.0–149.0)
VLDL: 18 mg/dL (ref 0.0–40.0)

## 2020-02-17 LAB — BASIC METABOLIC PANEL
BUN: 18 mg/dL (ref 6–23)
CO2: 28 mEq/L (ref 19–32)
Calcium: 9.9 mg/dL (ref 8.4–10.5)
Chloride: 100 mEq/L (ref 96–112)
Creatinine, Ser: 0.84 mg/dL (ref 0.40–1.20)
GFR: 78.1 mL/min (ref >60.00)
Glucose, Bld: 160 mg/dL — ABNORMAL HIGH (ref 70–99)
Potassium: 4.3 mEq/L (ref 3.5–5.1)
Sodium: 136 mEq/L (ref 135–145)

## 2020-02-17 LAB — HEPATIC FUNCTION PANEL
ALT: 11 U/L (ref 0–35)
AST: 13 U/L (ref 0–37)
Albumin: 3.9 g/dL (ref 3.5–5.2)
Alkaline Phosphatase: 76 U/L (ref 39–117)
Bilirubin, Direct: 0.1 mg/dL (ref 0.0–0.3)
Total Bilirubin: 0.3 mg/dL (ref 0.2–1.2)
Total Protein: 7.1 g/dL (ref 6.0–8.3)

## 2020-02-20 ENCOUNTER — Other Ambulatory Visit: Payer: Self-pay | Admitting: General Practice

## 2020-02-20 DIAGNOSIS — E1069 Type 1 diabetes mellitus with other specified complication: Secondary | ICD-10-CM

## 2020-02-20 MED ORDER — ATORVASTATIN CALCIUM 20 MG PO TABS
20.0000 mg | ORAL_TABLET | Freq: Every day | ORAL | 6 refills | Status: DC
Start: 2020-02-20 — End: 2020-09-26

## 2020-03-22 ENCOUNTER — Other Ambulatory Visit: Payer: BC Managed Care – PPO

## 2020-03-22 ENCOUNTER — Other Ambulatory Visit: Payer: Self-pay

## 2020-03-22 DIAGNOSIS — Z20822 Contact with and (suspected) exposure to covid-19: Secondary | ICD-10-CM

## 2020-03-24 LAB — NOVEL CORONAVIRUS, NAA: SARS-CoV-2, NAA: NOT DETECTED

## 2020-03-24 LAB — SARS-COV-2, NAA 2 DAY TAT

## 2020-04-02 ENCOUNTER — Ambulatory Visit: Payer: BC Managed Care – PPO

## 2020-04-13 ENCOUNTER — Ambulatory Visit (INDEPENDENT_AMBULATORY_CARE_PROVIDER_SITE_OTHER): Payer: BC Managed Care – PPO

## 2020-04-13 ENCOUNTER — Other Ambulatory Visit: Payer: Self-pay

## 2020-04-13 DIAGNOSIS — E1069 Type 1 diabetes mellitus with other specified complication: Secondary | ICD-10-CM

## 2020-04-13 DIAGNOSIS — E785 Hyperlipidemia, unspecified: Secondary | ICD-10-CM

## 2020-04-13 NOTE — Addendum Note (Signed)
Addended by: Lana Fish on: 04/13/2020 03:30 PM   Modules accepted: Orders

## 2020-04-14 LAB — HEPATIC FUNCTION PANEL
AG Ratio: 1.3 (calc) (ref 1.0–2.5)
ALT: 9 U/L (ref 6–29)
AST: 13 U/L (ref 10–30)
Albumin: 3.9 g/dL (ref 3.6–5.1)
Alkaline phosphatase (APISO): 85 U/L (ref 31–125)
Bilirubin, Direct: 0.1 mg/dL (ref 0.0–0.2)
Globulin: 3 g/dL (calc) (ref 1.9–3.7)
Indirect Bilirubin: 0.3 mg/dL (calc) (ref 0.2–1.2)
Total Bilirubin: 0.4 mg/dL (ref 0.2–1.2)
Total Protein: 6.9 g/dL (ref 6.1–8.1)

## 2020-04-16 ENCOUNTER — Encounter: Payer: Self-pay | Admitting: General Practice

## 2020-04-23 ENCOUNTER — Other Ambulatory Visit: Payer: Self-pay | Admitting: Family Medicine

## 2020-05-02 ENCOUNTER — Other Ambulatory Visit: Payer: Self-pay

## 2020-05-03 ENCOUNTER — Encounter: Payer: Self-pay | Admitting: Family Medicine

## 2020-05-03 ENCOUNTER — Ambulatory Visit: Payer: BC Managed Care – PPO | Admitting: Family Medicine

## 2020-05-03 VITALS — BP 124/72 | HR 89 | Temp 97.5°F | Resp 20 | Wt 217.0 lb

## 2020-05-03 DIAGNOSIS — E10649 Type 1 diabetes mellitus with hypoglycemia without coma: Secondary | ICD-10-CM | POA: Diagnosis not present

## 2020-05-03 DIAGNOSIS — R400 Somnolence: Secondary | ICD-10-CM

## 2020-05-03 DIAGNOSIS — R3589 Other polyuria: Secondary | ICD-10-CM | POA: Diagnosis not present

## 2020-05-03 DIAGNOSIS — R5383 Other fatigue: Secondary | ICD-10-CM | POA: Diagnosis not present

## 2020-05-03 DIAGNOSIS — R300 Dysuria: Secondary | ICD-10-CM | POA: Diagnosis not present

## 2020-05-03 DIAGNOSIS — N3944 Nocturnal enuresis: Secondary | ICD-10-CM | POA: Diagnosis not present

## 2020-05-03 LAB — BASIC METABOLIC PANEL
BUN: 17 mg/dL (ref 6–23)
CO2: 25 mEq/L (ref 19–32)
Calcium: 9.3 mg/dL (ref 8.4–10.5)
Chloride: 100 mEq/L (ref 96–112)
Creatinine, Ser: 0.68 mg/dL (ref 0.40–1.20)
GFR: 114.64 mL/min (ref >60.00)
Glucose, Bld: 294 mg/dL — ABNORMAL HIGH (ref 70–99)
Potassium: 4.5 mEq/L (ref 3.5–5.1)
Sodium: 133 mEq/L — ABNORMAL LOW (ref 135–145)

## 2020-05-03 LAB — MICROALBUMIN / CREATININE URINE RATIO
Creatinine,U: 80.6 mg/dL
Microalb Creat Ratio: 0.9 mg/g (ref 0.0–30.0)
Microalb, Ur: <0.7 mg/dL (ref 0.0–1.9)

## 2020-05-03 LAB — HEMOGLOBIN A1C: Hgb A1c MFr Bld: 8.5 % — ABNORMAL HIGH (ref 4.6–6.5)

## 2020-05-03 LAB — POCT URINALYSIS DIPSTICK
Bilirubin, UA: NEGATIVE
Blood, UA: NEGATIVE
Glucose, UA: POSITIVE — AB
Ketones, UA: NEGATIVE
Leukocytes, UA: NEGATIVE
Nitrite, UA: NEGATIVE
Protein, UA: NEGATIVE
Spec Grav, UA: 1.015 (ref 1.010–1.025)
Urobilinogen, UA: 0.2 E.U./dL
pH, UA: 7 (ref 5.0–8.0)

## 2020-05-03 LAB — CBC WITH DIFFERENTIAL/PLATELET
Basophils Absolute: 0.1 10*3/uL (ref 0.0–0.1)
Basophils Relative: 0.8 % (ref 0.0–3.0)
Eosinophils Absolute: 0.2 10*3/uL (ref 0.0–0.7)
Eosinophils Relative: 2.6 % (ref 0.0–5.0)
HCT: 40.7 % (ref 36.0–46.0)
Hemoglobin: 14.1 g/dL (ref 12.0–15.0)
Lymphocytes Relative: 35.8 % (ref 12.0–46.0)
Lymphs Abs: 2.9 10*3/uL (ref 0.7–4.0)
MCHC: 34.5 g/dL (ref 30.0–36.0)
MCV: 81.9 fl (ref 78.0–100.0)
Monocytes Absolute: 0.5 10*3/uL (ref 0.1–1.0)
Monocytes Relative: 5.7 % (ref 3.0–12.0)
Neutro Abs: 4.5 10*3/uL (ref 1.4–7.7)
Neutrophils Relative %: 55.1 % (ref 43.0–77.0)
Platelets: 356 10*3/uL (ref 150.0–400.0)
RBC: 4.97 Mil/uL (ref 3.87–5.11)
RDW: 13.5 % (ref 11.5–15.5)
WBC: 8.1 10*3/uL (ref 4.0–10.5)

## 2020-05-03 LAB — HEPATIC FUNCTION PANEL
ALT: 8 U/L (ref 0–35)
AST: 8 U/L (ref 0–37)
Albumin: 3.7 g/dL (ref 3.5–5.2)
Alkaline Phosphatase: 84 U/L (ref 39–117)
Bilirubin, Direct: 0.1 mg/dL (ref 0.0–0.3)
Total Bilirubin: 0.4 mg/dL (ref 0.2–1.2)
Total Protein: 6.7 g/dL (ref 6.0–8.3)

## 2020-05-03 LAB — TSH: TSH: 1.92 u[IU]/mL (ref 0.35–4.50)

## 2020-05-03 NOTE — Patient Instructions (Signed)
Follow up as needed or as scheduled We'll notify you of your lab results and make any changes if needed Try and eliminate caffeine if possible (this is a diuretic and will make you have to urinate more) Continue to keep good control of your sugars If no obvious cause and your symptoms continue, we will send you to urology for evaluation Call with any questions or concerns Hang in there!

## 2020-05-03 NOTE — Assessment & Plan Note (Signed)
Pt reports she is coming home, eating dinner, and going straight to bed.  She also reports she is having a very difficult time staying awake while driving.  Will check labs to assess for metabolic cause of fatigue.

## 2020-05-03 NOTE — Progress Notes (Signed)
   Subjective:    Patient ID: Kristen Carrillo, female    DOB: [birth year], 34 y.o.   MRN: [id number]  HPI Increased urine volume- pt reports 'sugars haven't been outrageous'  Pt wet the bed 2 nights ago.  Denies daytime frequency but is waking at night to use bathroom (has always gotten up once nightly).  sxs have been notable for the past month.  Sugars running 100-215.  Denies increased thirst.  No dysuria.  No blood in urine.  Denies suprapubic pain.  Fatigue- pt reports sxs have worsened over the last few weeks to the point where she feels like she will fall asleep driving.   sxs seem to only occur w/ driving.  Pt has been going to bed by 7:30-8:00pm and will wake at 5am.  Feels she is sleeping ok at night.   Review of Systems For ROS see HPI   This visit occurred during the SARS-CoV-2 public health emergency.  Safety protocols were in place, including screening questions prior to the visit, additional usage of staff PPE, and extensive cleaning of exam room while observing appropriate contact time as indicated for disinfecting solutions.       Objective:   Physical Exam Vitals reviewed.  Constitutional:      General: She is not in acute distress.    Appearance: She is well-developed. She is not ill-appearing.  HENT:     Head: Normocephalic and atraumatic.  Eyes:     Conjunctiva/sclera: Conjunctivae normal.     Pupils: Pupils are equal, round, and reactive to light.  Neck:     Thyroid: No thyromegaly.  Cardiovascular:     Rate and Rhythm: Normal rate and regular rhythm.     Heart sounds: Normal heart sounds. No murmur heard.   Pulmonary:     Effort: Pulmonary effort is normal. No respiratory distress.     Breath sounds: Normal breath sounds.  Abdominal:     General: There is no distension.     Palpations: Abdomen is soft.     Tenderness: There is no abdominal tenderness. There is no right CVA tenderness, left CVA tenderness, guarding or rebound.  Musculoskeletal:      Cervical back: Normal range of motion and neck supple.  Lymphadenopathy:     Cervical: No cervical adenopathy.  Skin:    General: Skin is warm and dry.  Neurological:     Mental Status: She is alert and oriented to person, place, and time.  Psychiatric:        Behavior: Behavior normal.           Assessment & Plan:  Increased urine volume/bed wetting- new.  No evidence of UTI on UA and sxs are not consistent.  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  If sxs continue, will refer to urology.  Pt expressed understanding and is in agreement w/ plan.

## 2020-05-03 NOTE — Assessment & Plan Note (Signed)
Follows w/ Dr Elvera Lennox.  Given increased urine volume and bed wetting, will check A1C.  Since she has given a urine today, will also get microalbumin.

## 2020-05-12 ENCOUNTER — Other Ambulatory Visit: Payer: Self-pay | Admitting: Internal Medicine

## 2020-05-22 ENCOUNTER — Encounter: Payer: Self-pay | Admitting: Internal Medicine

## 2020-06-04 ENCOUNTER — Other Ambulatory Visit: Payer: Self-pay

## 2020-06-04 ENCOUNTER — Encounter: Payer: Self-pay | Admitting: Internal Medicine

## 2020-06-04 ENCOUNTER — Ambulatory Visit: Payer: BC Managed Care – PPO | Admitting: Internal Medicine

## 2020-06-04 VITALS — BP 132/88 | HR 118 | Ht 68.0 in | Wt 219.4 lb

## 2020-06-04 DIAGNOSIS — E1069 Type 1 diabetes mellitus with other specified complication: Secondary | ICD-10-CM

## 2020-06-04 DIAGNOSIS — E785 Hyperlipidemia, unspecified: Secondary | ICD-10-CM | POA: Diagnosis not present

## 2020-06-04 DIAGNOSIS — E10649 Type 1 diabetes mellitus with hypoglycemia without coma: Secondary | ICD-10-CM | POA: Diagnosis not present

## 2020-06-04 DIAGNOSIS — E669 Obesity, unspecified: Secondary | ICD-10-CM

## 2020-06-04 LAB — POCT GLYCOSYLATED HEMOGLOBIN (HGB A1C): Hemoglobin A1C: 8.1 % — AB (ref 4.0–5.6)
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
## 2020-06-04 NOTE — Patient Instructions (Addendum)
Please change: - basal rates: 12 am: 2.0 5 am:  2.0 8 am: 1.65 11 am: 1.7 4 pm: 1.7 8 pm: 1.7 - ICR:  12 am: 7.0 >> 6.0 - target: 110-110 - ISF: 40, except 50 between 12 am-4 pm - Insulin on Board: 3.5h   Try to stay in the auto mode as much as possible.   Calibrate the sensor 4x a day, including at bedtime.  Call [employer] to change the pump.  Please return in 3-4 months.

## 2020-07-11 ENCOUNTER — Ambulatory Visit (INDEPENDENT_AMBULATORY_CARE_PROVIDER_SITE_OTHER): Payer: BC Managed Care – PPO | Admitting: Family Medicine

## 2020-07-11 ENCOUNTER — Encounter: Payer: Self-pay | Admitting: Family Medicine

## 2020-07-11 ENCOUNTER — Other Ambulatory Visit: Payer: Self-pay

## 2020-07-11 VITALS — BP 122/82 | HR 93 | Temp 97.3°F | Resp 16 | Ht 68.5 in | Wt 222.6 lb

## 2020-07-11 DIAGNOSIS — E559 Vitamin D deficiency, unspecified: Secondary | ICD-10-CM

## 2020-07-11 DIAGNOSIS — E785 Hyperlipidemia, unspecified: Secondary | ICD-10-CM | POA: Diagnosis not present

## 2020-07-11 DIAGNOSIS — R400 Somnolence: Secondary | ICD-10-CM | POA: Insufficient documentation

## 2020-07-11 DIAGNOSIS — E1069 Type 1 diabetes mellitus with other specified complication: Secondary | ICD-10-CM

## 2020-07-11 DIAGNOSIS — Z0000 Encounter for general adult medical examination without abnormal findings: Secondary | ICD-10-CM | POA: Diagnosis not present

## 2020-07-11 DIAGNOSIS — G4733 Obstructive sleep apnea (adult) (pediatric): Secondary | ICD-10-CM | POA: Insufficient documentation

## 2020-07-11 LAB — BASIC METABOLIC PANEL
BUN: 17 mg/dL (ref 6–23)
CO2: 24 mEq/L (ref 19–32)
Calcium: 9.4 mg/dL (ref 8.4–10.5)
Chloride: 102 mEq/L (ref 96–112)
Creatinine, Ser: 0.68 mg/dL (ref 0.40–1.20)
GFR: 114.48 mL/min (ref >60.00)
Glucose, Bld: 128 mg/dL — ABNORMAL HIGH (ref 70–99)
Potassium: 4.5 mEq/L (ref 3.5–5.1)
Sodium: 135 mEq/L (ref 135–145)

## 2020-07-11 LAB — CBC WITH DIFFERENTIAL/PLATELET
Basophils Absolute: 0.1 10*3/uL (ref 0.0–0.1)
Basophils Relative: 0.6 % (ref 0.0–3.0)
Eosinophils Absolute: 0.4 10*3/uL (ref 0.0–0.7)
Eosinophils Relative: 3.7 % (ref 0.0–5.0)
HCT: 42.5 % (ref 36.0–46.0)
Hemoglobin: 14.5 g/dL (ref 12.0–15.0)
Lymphocytes Relative: 32.5 % (ref 12.0–46.0)
Lymphs Abs: 3.4 10*3/uL (ref 0.7–4.0)
MCHC: 34.2 g/dL (ref 30.0–36.0)
MCV: 82.4 fl (ref 78.0–100.0)
Monocytes Absolute: 0.6 10*3/uL (ref 0.1–1.0)
Monocytes Relative: 6 % (ref 3.0–12.0)
Neutro Abs: 5.9 10*3/uL (ref 1.4–7.7)
Neutrophils Relative %: 57.2 % (ref 43.0–77.0)
Platelets: 400 10*3/uL (ref 150.0–400.0)
RBC: 5.15 Mil/uL — ABNORMAL HIGH (ref 3.87–5.11)
RDW: 13.3 % (ref 11.5–15.5)
WBC: 10.3 10*3/uL (ref 4.0–10.5)

## 2020-07-11 LAB — HEPATIC FUNCTION PANEL
ALT: 8 U/L (ref 0–35)
AST: 10 U/L (ref 0–37)
Albumin: 3.9 g/dL (ref 3.5–5.2)
Alkaline Phosphatase: 84 U/L (ref 39–117)
Bilirubin, Direct: 0.1 mg/dL (ref 0.0–0.3)
Total Bilirubin: 0.4 mg/dL (ref 0.2–1.2)
Total Protein: 7 g/dL (ref 6.0–8.3)

## 2020-07-11 LAB — LIPID PANEL
Cholesterol: 177 mg/dL (ref 0–200)
HDL: 70.2 mg/dL (ref >39.00)
LDL Cholesterol: 91 mg/dL (ref 0–99)
NonHDL: 106.85
Total CHOL/HDL Ratio: 3
Triglycerides: 77 mg/dL (ref 0.0–149.0)
VLDL: 15.4 mg/dL (ref 0.0–40.0)

## 2020-07-11 LAB — VITAMIN D 25 HYDROXY (VIT D DEFICIENCY, FRACTURES): VITD: 45.79 ng/mL (ref 30.00–100.00)

## 2020-07-11 LAB — TSH: TSH: 3.28 u[IU]/mL (ref 0.35–4.50)

## 2020-07-11 NOTE — Patient Instructions (Addendum)
Follow up in 6 months to recheck cholesterol We'll notify you of your lab results and make any changes if needed Continue to work on healthy diet and regular exercise- you can do it! Call with any questions or concerns Stay Safe!  Stay Healthy! Happy New Year!

## 2020-07-11 NOTE — Progress Notes (Signed)
   Subjective:    Patient ID: Kristen Carrillo, female    DOB: 1986-06-10, 34 y.o.   MRN: [id number]  HPI CPE- UTD on eye exam, pap smear, COVID (including booster), flu, Tdap  Reviewed past medical, surgical, family and social histories.   Health Maintenance  Topic Date Due  . PNEUMOCOCCAL POLYSACCHARIDE VACCINE AGE 66-64 HIGH RISK  02/15/2021 (Originally 03/03/1988)  . Hepatitis C Screening  02/15/2021 (Originally 1986-05-30)  . HEMOGLOBIN A1C  12/02/2020  . PAP SMEAR-Modifier  04/13/2021  . FOOT EXAM  05/02/2021  . URINE MICROALBUMIN  05/03/2021  . OPHTHALMOLOGY EXAM  07/02/2021  . TETANUS/TDAP  07/15/2023  . INFLUENZA VACCINE  Completed  . COVID-19 Vaccine  Completed  . HIV Screening  Completed    Patient Care Team    Relationship Specialty Notifications Start End  Sheliah Hatch, MD PCP - General Family Medicine  10/11/15   Carrington Clamp, MD Consulting Physician Obstetrics and Gynecology  10/11/15   Carlus Pavlov, MD Consulting Physician Internal Medicine  10/11/15   Estrella Deeds, OD Physician Assistant Optometry  10/11/15   Alferd Patee, MD Referring Physician Anesthesiology  10/11/15   Ellis Savage, NP Nurse Practitioner   10/11/15       Review of Systems Patient reports no vision/ hearing changes, adenopathy,fever, weight change,  persistant/recurrent hoarseness , swallowing issues, chest pain, palpitations, edema, persistant/recurrent cough, hemoptysis, dyspnea (rest/exertional/paroxysmal nocturnal), gastrointestinal bleeding (melena, rectal bleeding), abdominal pain, significant heartburn, bowel changes, GU symptoms (dysuria, hematuria, incontinence), Gyn symptoms (abnormal  bleeding, pain),  syncope, focal weakness, memory loss, numbness & tingling, skin/hair/nail changes, abnormal bruising or bleeding, anxiety, or depression.   This visit occurred during the SARS-CoV-2 public health emergency.  Safety protocols were in place, including screening questions prior  to the visit, additional usage of staff PPE, and extensive cleaning of exam room while observing appropriate contact time as indicated for disinfecting solutions.       Objective:   Physical Exam General Appearance:    Alert, cooperative, no distress, appears stated age, obese  Head:    Normocephalic, without obvious abnormality, atraumatic  Eyes:    PERRL, conjunctiva/corneas clear, EOM's intact, fundi    benign, both eyes  Ears:    Normal TM's and external ear canals, both ears  Nose:   Deferred due to COVID  Throat:   Neck:   Supple, symmetrical, trachea midline, no adenopathy;    Thyroid: no enlargement/tenderness/nodules  Back:     Symmetric, no curvature, ROM normal, no CVA tenderness  Lungs:     Clear to auscultation bilaterally, respirations unlabored  Chest Wall:    No tenderness or deformity   Heart:    Regular rate and rhythm, S1 and S2 normal, no murmur, rub   or gallop  Breast Exam:    Deferred to GYN  Abdomen:     Soft, non-tender, bowel sounds active all four quadrants,    no masses, no organomegaly  Genitalia:    Deferred to GYN  Rectal:    Extremities:   Extremities normal, atraumatic, no cyanosis or edema  Pulses:   2+ and symmetric all extremities  Skin:   Skin color, texture, turgor normal, no rashes or lesions  Lymph nodes:   Cervical, supraclavicular, and axillary nodes normal  Neurologic:   CNII-XII intact, normal strength, sensation and reflexes    throughout          Assessment & Plan:

## 2020-07-11 NOTE — Assessment & Plan Note (Signed)
Pt's PE WNL w/ exception of obesity.  UTD on pap, eye exam, immunizations.  Check labs.  Anticipatory guidance provided.

## 2020-07-11 NOTE — Assessment & Plan Note (Signed)
Pt has hx of this.  Check labs and replete prn. 

## 2020-07-11 NOTE — Assessment & Plan Note (Signed)
Chronic problem.  Tolerating statin w/o difficulty.  Check labs.  Adjust meds prn  

## 2020-07-24 ENCOUNTER — Encounter: Payer: Self-pay | Admitting: Family Medicine

## 2020-07-24 NOTE — Telephone Encounter (Signed)
Reviewing in PCP absence.    She can be put on my schedule for tomorrow as long as no alarm signs/symptoms are present.  If symptoms are severe then we can see if providers are offering evening video hours as some in our division have started doing this.

## 2020-07-25 ENCOUNTER — Telehealth (INDEPENDENT_AMBULATORY_CARE_PROVIDER_SITE_OTHER): Payer: BC Managed Care – PPO | Admitting: Physician Assistant

## 2020-07-25 ENCOUNTER — Other Ambulatory Visit: Payer: Self-pay

## 2020-07-25 ENCOUNTER — Encounter: Payer: Self-pay | Admitting: Physician Assistant

## 2020-07-25 DIAGNOSIS — B9689 Other specified bacterial agents as the cause of diseases classified elsewhere: Secondary | ICD-10-CM | POA: Diagnosis not present

## 2020-07-25 DIAGNOSIS — J019 Acute sinusitis, unspecified: Secondary | ICD-10-CM | POA: Diagnosis not present

## 2020-07-25 MED ORDER — AMOXICILLIN-POT CLAVULANATE 875-125 MG PO TABS: 1.0000 | ORAL_TABLET | Freq: Two times a day (BID) | ORAL | 0 refills | Status: DC

## 2020-07-25 NOTE — Progress Notes (Signed)
I have discussed the procedure for the virtual visit with the patient who has given consent to proceed with assessment and treatment.   Melony Tenpas S Shammara Jarrett, CMA     

## 2020-07-25 NOTE — Progress Notes (Signed)
Virtual Visit via Video   I connected with patient on 07/25/20 at  1:30 PM EST by a video enabled telemedicine application and verified that I am speaking with the correct person using two identifiers.  Location patient: Home Location provider: [facility], Office Persons participating in the virtual visit: Patient, Provider, [place] (Patina Moore)  I discussed the limitations of evaluation and management by telemedicine and the availability of in person appointments. The patient expressed understanding and agreed to proceed.  Subjective:   HPI:   Patient presents via [facility] today c/o 5 days of nasal congestion, sinus congestion with pressure and now sinus pain, facial pain and tooth pain. Denies fever, chills. Some initial aches that have resolved. Denies significant chest congestion but notes cough with this. Denies chest pain or over SOB. Denies loss of taste or smell.  Notes she had a rapid test 2 days ago which was negative.  ROS:   See pertinent positives and negatives per HPI.  Patient Active Problem List   Diagnosis Date Noted  . Daytime somnolence 07/11/2020  . Obstructive sleep apnea syndrome 07/11/2020  . Nocturnal cough 02/16/2020  . Meralgia paraesthetica, left 02/16/2020  . Vitamin D deficiency 07/11/2019  . Hyperlipidemia due to type 1 diabetes mellitus ([place]) 07/11/2019  . Physical exam 07/05/2018  . OCD (obsessive compulsive disorder) 10/14/2015  . Attention deficit hyperactivity disorder (ADHD) 10/14/2015  . Fatigue 10/11/2015  . Type 1 diabetes mellitus with hypoglycemia and without coma ([place]) 09/19/2014  . DYSURIA 12/15/2007  . HEAT INTOLERANCE 12/15/2007    Social History   Tobacco Use  . Smoking status: Former Smoker    Types: Cigarettes    Quit date: 07/14/2009    Years since quitting: 11.0  . Smokeless tobacco: Never Used  Substance Use Topics  . Alcohol use: Yes    Alcohol/week: 0.0 standard drinks    Comment: occasionally     Current Outpatient Medications:  .  amphetamine-dextroamphetamine (ADDERALL) 20 MG tablet, 2 tablets in the morning and 1 in the evening., Disp: , Rfl: 0 .  atorvastatin (LIPITOR) 20 MG tablet, Take 1 tablet (20 mg total) by mouth daily., Disp: 30 tablet, Rfl: 6 .  Cholecalciferol (VITAMIN D3) 5000 units CAPS, Take 1 capsule by mouth daily., Disp: , Rfl:  .  famotidine (PEPCID) 40 MG tablet, TAKE 1 TABLET BY MOUTH EVERY DAY, Disp: 90 tablet, Rfl: 1 .  FIASP 100 UNIT/ML SOLN, INJECT 80 UNITS INTO THE SKIN DAILY. USE IN INSULIN PUMP, Disp: 70 mL, Rfl: 5 .  Fluvoxamine Maleate 150 MG CP24, Take 2 capsules by mouth at bedtime. , Disp: , Rfl:  .  glucagon (GLUCAGON EMERGENCY) 1 MG injection, Glucagon Emergency Kit (human-recomb) 1 mg solution for injection, Disp: 1 each, Rfl: 11 .  Glucagon 3 MG/DOSE POWD, Place 3 mg into the nose once as needed for up to 1 dose., Disp: 1 each, Rfl: 11 .  glucose blood (CONTOUR NEXT TEST) test strip, Use as instructed to test 3 times daily, Disp: 300 each, Rfl: 1 .  Insulin Pen Needle 32G X 4 MM MISC, Use 1-4x a day, Disp: 200 each, Rfl: 3 .  JORNAY PM 100 MG CP24, , Disp: , Rfl:  .  LORazepam (ATIVAN) 1 MG tablet, , Disp: , Rfl:  .  NUVARING 0.12-0.015 MG/24HR vaginal ring, INSERT 1 RING VAGINALLY AS DIRECTED. REMOVE AFTER 3 WEEKS & WAIT 7 DAYS BEFORE INSERTING A NEW RING, Disp: , Rfl: 4 .  Urine Glucose-Ketones Test STRP,  Use 1-3 times daily as needed, Disp: 50 each, Rfl: 11 .  WELLBUTRIN XL 300 MG 24 hr tablet, , Disp: , Rfl:   Allergies  Allergen Reactions  . Adhesive [Tape] Rash  . Colophony [Pinus Strobus] Hives and Rash  . Formaldehyde Hives and Rash    Like a burn  . Latex Rash  . Nickel Hives and Rash  . Sulfa Antibiotics Rash    Objective:   There were no vitals taken for this visit.  Patient is well-developed, well-nourished in no acute distress.  Resting comfortably at home.  Head is normocephalic, atraumatic.  No labored breathing.   Speech is clear and coherent with logical content.  Patient is alert and oriented at baseline.  + TTP maxillary  Assessment and Plan:   1. Acute bacterial sinusitis Given high prevalence of COVID in this area and abrupt onset of her symptoms, want her to get a actual PCR test as rapid test can be unreliable especially in the earlier course of illness.  She is to quarantine until these results are in.  She is to continue vitamin D, vitamin C and zinc supplement.  Supportive measures and OTC medications reviewed.  Given the significance of maxillary sinus pain, concern for developing sinusitis.  We will add on Augmentin to her current regimen.  Strict ER precautions discussed with patient who voiced understanding and agreement with plan. - amoxicillin-clavulanate (AUGMENTIN) 875-125 MG tablet; Take 1 tablet by mouth 2 (two) times daily.  Dispense: 14 tablet; Refill: 0 .   Leeanne Rio, PA-C 07/25/2020

## 2020-07-27 ENCOUNTER — Other Ambulatory Visit: Payer: BC Managed Care – PPO

## 2020-07-27 DIAGNOSIS — Z20822 Contact with and (suspected) exposure to covid-19: Secondary | ICD-10-CM

## 2020-07-28 ENCOUNTER — Other Ambulatory Visit: Payer: BC Managed Care – PPO

## 2020-07-29 LAB — SARS-COV-2, NAA 2 DAY TAT

## 2020-07-29 LAB — NOVEL CORONAVIRUS, NAA: SARS-CoV-2, NAA: NOT DETECTED

## 2020-08-29 ENCOUNTER — Other Ambulatory Visit: Payer: Self-pay | Admitting: Family Medicine

## 2020-09-25 ENCOUNTER — Other Ambulatory Visit: Payer: Self-pay | Admitting: Family Medicine

## 2020-10-07 ENCOUNTER — Other Ambulatory Visit: Payer: Self-pay | Admitting: Family Medicine

## 2020-10-09 ENCOUNTER — Ambulatory Visit: Payer: BC Managed Care – PPO | Admitting: Internal Medicine

## 2020-10-09 ENCOUNTER — Encounter: Payer: Self-pay | Admitting: Internal Medicine

## 2020-10-09 ENCOUNTER — Other Ambulatory Visit: Payer: Self-pay

## 2020-10-09 VITALS — BP 138/90 | HR 105 | Ht 68.5 in | Wt 223.4 lb

## 2020-10-09 DIAGNOSIS — E1069 Type 1 diabetes mellitus with other specified complication: Secondary | ICD-10-CM

## 2020-10-09 DIAGNOSIS — E10649 Type 1 diabetes mellitus with hypoglycemia without coma: Secondary | ICD-10-CM

## 2020-10-09 DIAGNOSIS — E785 Hyperlipidemia, unspecified: Secondary | ICD-10-CM

## 2020-10-09 DIAGNOSIS — E669 Obesity, unspecified: Secondary | ICD-10-CM

## 2020-10-09 LAB — POCT GLYCOSYLATED HEMOGLOBIN (HGB A1C): Hemoglobin A1C: 7.6 % — AB (ref 4.0–5.6)
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
## 2020-10-09 NOTE — Patient Instructions (Addendum)
Please change: - basal rates: 12 am: 1.8  5 am:  1.8 >> 1.9 8 am: 1.8 - ICR:  12 am: 6.0 - target: 110-110 - ISF: 40, except 50 between 12 am-4 pm - Insulin on Board: 3.5 h   Try to stay in the auto mode as much as possible.   Calibrate the sensor 4x a day, including at bedtime.  Please return in 4 months.

## 2020-10-19 DIAGNOSIS — S52182P Other fracture of upper end of left radius, subsequent encounter for closed fracture with malunion: Secondary | ICD-10-CM | POA: Insufficient documentation

## 2020-11-16 NOTE — Progress Notes (Signed)
[facility] Note  11/19/2020     CHIEF COMPLAINT Patient presents for Retina Follow Up   HISTORY OF PRESENT ILLNESS: Kristen Carrillo is a 35 y.o. female who presents to the clinic today for:   HPI    Retina Follow Up    Patient presents with  Other.  In both eyes.  This started 1 year ago.  Since onset it is stable.  I, the attending physician,  performed the HPI with the patient and updated documentation appropriately.          Comments    Pt here for 1 year retina follow up, bi lateral retinal lattice degen. Pt reports vision is stable, no changes or issues. Did have specs updated within the last year. No ocular pain or discomfort noted. Blood sugar was 119 this am. A1C @ end of March was 7.5.       Last edited by Bernarda Caffey, MD on 11/20/2020 11:25 PM. (History)    pt states vision has been stable  Referring physician: Midge Minium, MD [address] [place],  [place] [address]  HISTORICAL INFORMATION:   Selected notes from the MEDICAL RECORD NUMBER Referred by Dr. Wyatt Portela for concern of lattice degeneration OU and atrophic holes OD LEE: 09.03.19 (S. Groat) [BCVA: OD: 20/25 OS:20/25] Ocular Hx-high myopia PMH-anxiety, depression, OCD, DM1 (takes novolog)    CURRENT MEDICATIONS: No current outpatient medications on file. (Ophthalmic Drugs)   No current facility-administered medications for this visit. (Ophthalmic Drugs)   Current Outpatient Medications (Other)  Medication Sig  . amoxicillin-clavulanate (AUGMENTIN) 875-125 MG tablet Take 1 tablet by mouth 2 (two) times daily.  Marland Kitchen amphetamine-dextroamphetamine (ADDERALL) 20 MG tablet 2 tablets in the morning and 1 in the evening.  Marland Kitchen atorvastatin (LIPITOR) 20 MG tablet TAKE 1 TABLET BY MOUTH EVERY DAY  . Cholecalciferol (VITAMIN D3) 5000 units CAPS Take 1 capsule by mouth daily.  . famotidine (PEPCID) 40 MG tablet TAKE 1 TABLET BY MOUTH EVERY DAY  . FIASP 100 UNIT/ML SOLN  INJECT 80 UNITS INTO THE SKIN DAILY. USE IN INSULIN PUMP  . Fluvoxamine Maleate 150 MG CP24 Take 2 capsules by mouth at bedtime.   Marland Kitchen glucagon (GLUCAGON EMERGENCY) 1 MG injection Glucagon Emergency Kit (human-recomb) 1 mg solution for injection  . Glucagon 3 MG/DOSE POWD Place 3 mg into the nose once as needed for up to 1 dose.  Marland Kitchen glucose blood (CONTOUR NEXT TEST) test strip Use as instructed to test 3 times daily  . Insulin Pen Needle 32G X 4 MM MISC Use 1-4x a day  . LORazepam (ATIVAN) 1 MG tablet   . NUVARING 0.12-0.015 MG/24HR vaginal ring INSERT 1 RING VAGINALLY AS DIRECTED. REMOVE AFTER 3 WEEKS & WAIT 7 DAYS BEFORE INSERTING A NEW RING  . Urine Glucose-Ketones Test STRP Use 1-3 times daily as needed  . WELLBUTRIN XL 300 MG 24 hr tablet    No current facility-administered medications for this visit. (Other)      REVIEW OF SYSTEMS: ROS    Positive for: Eyes   Negative for: Constitutional, Gastrointestinal, Neurological, Skin, Genitourinary, Musculoskeletal, HENT, Endocrine, Cardiovascular, Respiratory, Psychiatric, Allergic/Imm, Heme/Lymph   Last edited by Kingsley Spittle, COT on 11/19/2020  3:14 PM. (History)       ALLERGIES Allergies  Allergen Reactions  . Adhesive [Tape] Rash  . Colophony [Pinus Strobus] Hives and Rash  . Formaldehyde Hives and Rash    Like a burn  .  Latex Rash  . Nickel Hives and Rash  . Sulfa Antibiotics Rash    PAST MEDICAL HISTORY Past Medical History:  Diagnosis Date  . Anxiety   . Depression   . Diabetes mellitus without complication [facility])    DMT1 age 77 Dr. Benjiman Core Velora Heckler)  . Pneumonia    2005   Past Surgical History:  Procedure Laterality Date  . CERVICAL POLYPECTOMY N/A 04/25/2016   Procedure: CERVICAL POLYPECTOMY;  Surgeon: Bobbye Charleston, MD;  Location: [facility];  Service: Gynecology;  Laterality: N/A;  . HYSTEROSCOPY WITH D & C N/A 04/25/2016   Procedure: DILATATION AND CURETTAGE /HYSTEROSCOPY;  Surgeon: Bobbye Charleston, MD;  Location: [facility];  Service: Gynecology;  Laterality: N/A;  . MULTIPLE TOOTH EXTRACTIONS    . RETINAL LASER PROCEDURE    . TYMPANOSTOMY TUBE PLACEMENT  age 72    FAMILY HISTORY Family History  Problem Relation Age of Onset  . Diabetes Mother   . Hypertension Mother   . Hyperlipidemia Mother   . Thyroid disease Mother   . Heart disease Mother   . Diabetes Maternal Uncle   . Hypertension Maternal Uncle   . Hypertension Maternal Grandmother   . Heart disease Maternal Grandmother   . Diabetes Maternal Grandfather   . Hypertension Maternal Grandfather   . Hyperlipidemia Maternal Grandfather     SOCIAL HISTORY Social History   Tobacco Use  . Smoking status: Former Smoker    Types: Cigarettes    Quit date: 07/14/2009    Years since quitting: 11.3  . Smokeless tobacco: Never Used  Vaping Use  . Vaping Use: Never used  Substance Use Topics  . Alcohol use: Yes    Alcohol/week: 0.0 standard drinks    Comment: occasionally  . Drug use: No         OPHTHALMIC EXAM:  Base Eye Exam    Visual Acuity (Snellen - Linear)      Right Left   Dist cc 20/20 20/20   Correction: Glasses       Tonometry (Tonopen, 3:21 PM)      Right Left   Pressure 16 14       Pupils      Dark Light Shape React APD   Right 4 3 Round Brisk None   Left 4 3 Round Brisk None       Visual Fields (Counting fingers)      Left Right    Full Full       Extraocular Movement      Right Left    Full, Ortho Full, Ortho       Neuro/Psych    Oriented x3: Yes   Mood/Affect: Normal       Dilation    Both eyes: 1.0% Mydriacyl, 2.5% Phenylephrine @ 3:21 PM        Slit Lamp and Fundus Exam    Slit Lamp Exam      Right Left   Lids/Lashes Meibomian gland dysfunction Meibomian gland dysfunction   Conjunctiva/Sclera White and quiet White and quiet   Cornea 1+ Punctate epithelial erosions, trace tear film debris Trace Punctate epithelial erosions, trace tear film debris   Anterior  Chamber Deep and quiet Deep and quiet   Iris Round and dilated, No NVI Round and dilated, No NVI   Lens Clear Punctate cortical changes   Vitreous Vitreous syneresis, Posterior vitreous detachment, vitreous condensations Vitreous syneresis, Posterior vitreous detachment, vitreous condensations       Fundus Exam  Right Left   Disc Pink and Sharp, trace Peripapillary atrophy Pink and Sharp   C/D Ratio 0.2 0.2   Macula Flat, Good foveal reflex, Retinal pigment epithelial mottling, No heme or edema Flat, Good foveal reflex, Retinal pigment epithelial mottling, No heme or edema   Vessels mild attenuation, mild tortuousity attenuated, Tortuous, mild Copper wiring   Periphery Attached, VR tuft at 0530 equator, superior lattice degeneration at 1100-0130 ora, peripheral lattice SN quadrant, paving stone  at 0400 -- good laser from 1100 to 0130 and 0700 equator, lattice with atrophic holes at 1000 -- great laser changes, No new RT/RD/lattice, No heme  Attached, Lattice degeneration from 0500-0700, superior lattice from 1000 to 0200 ora - good laser surrounding all lesions, No new RT/RD/lattice, No heme         Refraction    Wearing Rx      Sphere Cylinder Axis   Right -10.25 +3.50 095   Left -9.00` +3.00 081          IMAGING AND PROCEDURES  Imaging and Procedures for $RemoveBefore'@TODAY'[email or web address]$ @  OCT, Retina - OU - Both Eyes       Right Eye Quality was good. Central Foveal Thickness: 301. Progression has been stable. Findings include normal foveal contour, no IRF, no SRF, vitreomacular adhesion .   Left Eye Quality was good. Central Foveal Thickness: 305. Progression has been stable. Findings include normal foveal contour, no IRF, no SRF.   Notes *Images captured and stored on drive  Diagnosis / Impression:  NFP, No IRF/SRF OU  Clinical management:  See below  Abbreviations: NFP - Normal foveal profile. CME - cystoid macular edema. PED - pigment epithelial detachment. IRF - intraretinal fluid.  SRF - subretinal fluid. EZ - ellipsoid zone. ERM - epiretinal membrane. ORA - outer retinal atrophy. ORT - outer retinal tubulation. SRHM - subretinal hyper-reflective material                  ASSESSMENT/PLAN:    ICD-10-CM   1. Bilateral retinal lattice degeneration  H35.413   2. Retinal holes, bilateral  H33.323   3. 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 Retinal edema  H35.81 OCT, Retina - OU - Both Eyes  5. High myopia, bilateral  H52.13     1,2. Lattice degeneration with atrophic retinal holes, OU -   - FS:ELTRVUYE lattice at ora, peripheral lattice SN quadrant, VR tuft at 530 equator OD  - new lattice with atrophic holes at 1000 OD noted 01.15.21  - OS: superior lattice from 1000 to 0200, lattice from 0500 to 0700 OS  - S/P laser retinopexy OD (10.04.19, 01.15.21) -- good laser surrounding lattice  - S/P laser retinopexy OS (11.01.19) -- good laser in place  - f/u 1 year  3. Diabetes mellitus, type 1 without retinopathy  - last A1c was 7.6 on 03.29.22  - The incidence, risk factors for progression, natural history and treatment options for diabetic retinopathy  were discussed with patient.    - The need for close monitoring of blood glucose, blood pressure, and serum lipids, avoiding cigarette or any type of tobacco, and the need for long term follow up was also discussed with patient.  - f/u in 1 year, sooner prn  4. No retinal edema on exam or OCT  5. High Myopia OU-   - discussed association of myopia with lattice degen and RT/RD  - doing well with current specs  - under the expert care  of Dr. Katy Fitch  Ophthalmic Meds Ordered this visit:  No orders of the defined types were placed in this encounter.    .  Return in about 1 year (around 11/19/2021) for f/u lattice degeneration / DM exam, DFE, OCT.  There are no Patient Instructions on file for this visit.   Explained the diagnoses, plan, and follow up with the patient and they  expressed understanding.  Patient expressed understanding of the importance of proper follow up care.   This document serves as a record of services personally performed by Gardiner Sleeper, MD, PhD. It was created on their behalf by Roselee Nova, COMT. The creation of this record is the provider's dictation and/or activities during the visit.  Electronically signed by: Roselee Nova, COMT 11/20/20 11:28 PM  This document serves as a record of services personally performed by Gardiner Sleeper, MD, PhD. It was created on their behalf by San Jetty. Owens Shark, OA an ophthalmic technician. The creation of this record is the provider's dictation and/or activities during the visit.    Electronically signed by: San Jetty. Marguerita Merles 05.09.2022 11:28 PM   Gardiner Sleeper, M.D., Ph.D. Diseases & Surgery of the Retina and [place] 11/16/2019   I have reviewed the above documentation for accuracy and completeness, and I agree with the above. Gardiner Sleeper, M.D., Ph.D. 11/20/20 11:28 PM   Abbreviations: M myopia (nearsighted); A astigmatism; H hyperopia (farsighted); P presbyopia; Mrx spectacle prescription;  CTL contact lenses; OD right eye; OS left eye; OU both eyes  XT exotropia; ET esotropia; PEK punctate epithelial keratitis; PEE punctate epithelial erosions; DES dry eye syndrome; MGD meibomian gland dysfunction; ATs artificial tears; PFAT's preservative free artificial tears; [place] nuclear sclerotic cataract; PSC posterior subcapsular cataract; ERM epi-retinal membrane; PVD posterior vitreous detachment; RD retinal detachment; DM diabetes mellitus; DR diabetic retinopathy; NPDR non-proliferative diabetic retinopathy; PDR proliferative diabetic retinopathy; CSME clinically significant macular edema; DME diabetic macular edema; dbh dot blot hemorrhages; CWS cotton wool spot; POAG primary open angle glaucoma; C/D cup-to-disc ratio; HVF humphrey visual field; GVF goldmann visual  field; OCT optical coherence tomography; IOP intraocular pressure; BRVO Branch retinal vein occlusion; CRVO central retinal vein occlusion; CRAO central retinal artery occlusion; BRAO branch retinal artery occlusion; RT retinal tear; SB scleral buckle; PPV pars plana vitrectomy; VH Vitreous hemorrhage; PRP panretinal laser photocoagulation; IVK intravitreal kenalog; VMT vitreomacular traction; MH Macular hole;  NVD neovascularization of the disc; NVE neovascularization elsewhere; AREDS age related eye disease study; ARMD age related macular degeneration; POAG primary open angle glaucoma; EBMD epithelial/anterior basement membrane dystrophy; ACIOL anterior chamber intraocular lens; IOL intraocular lens; PCIOL posterior chamber intraocular lens; Phaco/IOL phacoemulsification with intraocular lens placement; [place] photorefractive keratectomy; LASIK laser assisted in situ keratomileusis; HTN hypertension; DM diabetes mellitus; COPD chronic obstructive pulmonary disease

## 2020-11-19 ENCOUNTER — Encounter (INDEPENDENT_AMBULATORY_CARE_PROVIDER_SITE_OTHER): Payer: Self-pay | Admitting: Ophthalmology

## 2020-11-19 ENCOUNTER — Ambulatory Visit (INDEPENDENT_AMBULATORY_CARE_PROVIDER_SITE_OTHER): Payer: BC Managed Care – PPO | Admitting: Ophthalmology

## 2020-11-19 ENCOUNTER — Other Ambulatory Visit: Payer: Self-pay

## 2020-11-19 DIAGNOSIS — E109 Type 1 diabetes mellitus without complications: Secondary | ICD-10-CM

## 2020-11-19 DIAGNOSIS — H3581 Retinal edema: Secondary | ICD-10-CM

## 2020-11-19 DIAGNOSIS — H33323 Round hole, bilateral: Secondary | ICD-10-CM

## 2020-11-19 DIAGNOSIS — H35413 Lattice degeneration of retina, bilateral: Secondary | ICD-10-CM

## 2020-11-19 DIAGNOSIS — H5213 Myopia, bilateral: Secondary | ICD-10-CM

## 2020-11-20 ENCOUNTER — Encounter (INDEPENDENT_AMBULATORY_CARE_PROVIDER_SITE_OTHER): Payer: Self-pay | Admitting: Ophthalmology

## 2021-01-08 ENCOUNTER — Encounter: Payer: Self-pay | Admitting: Family Medicine

## 2021-01-08 ENCOUNTER — Other Ambulatory Visit: Payer: Self-pay

## 2021-01-08 ENCOUNTER — Ambulatory Visit: Payer: BC Managed Care – PPO | Admitting: Family Medicine

## 2021-01-08 VITALS — BP 128/96 | HR 104 | Temp 97.3°F | Resp 17 | Ht 68.5 in | Wt 221.6 lb

## 2021-01-08 DIAGNOSIS — E1069 Type 1 diabetes mellitus with other specified complication: Secondary | ICD-10-CM

## 2021-01-08 DIAGNOSIS — E785 Hyperlipidemia, unspecified: Secondary | ICD-10-CM

## 2021-01-08 DIAGNOSIS — E669 Obesity, unspecified: Secondary | ICD-10-CM | POA: Diagnosis not present

## 2021-01-08 DIAGNOSIS — R030 Elevated blood-pressure reading, without diagnosis of hypertension: Secondary | ICD-10-CM

## 2021-01-08 LAB — CBC WITH DIFFERENTIAL/PLATELET
Basophils Absolute: 0.1 10*3/uL (ref 0.0–0.1)
Basophils Relative: 0.6 % (ref 0.0–3.0)
Eosinophils Absolute: 0.3 10*3/uL (ref 0.0–0.7)
Eosinophils Relative: 2.3 % (ref 0.0–5.0)
HCT: 41.9 % (ref 36.0–46.0)
Hemoglobin: 14.1 g/dL (ref 12.0–15.0)
Lymphocytes Relative: 33.3 % (ref 12.0–46.0)
Lymphs Abs: 3.7 10*3/uL (ref 0.7–4.0)
MCHC: 33.8 g/dL (ref 30.0–36.0)
MCV: 83.3 fl (ref 78.0–100.0)
Monocytes Absolute: 0.5 10*3/uL (ref 0.1–1.0)
Monocytes Relative: 4.4 % (ref 3.0–12.0)
Neutro Abs: 6.6 10*3/uL (ref 1.4–7.7)
Neutrophils Relative %: 59.4 % (ref 43.0–77.0)
Platelets: 397 10*3/uL (ref 150.0–400.0)
RBC: 5.02 Mil/uL (ref 3.87–5.11)
RDW: 13 % (ref 11.5–15.5)
WBC: 11.1 10*3/uL — ABNORMAL HIGH (ref 4.0–10.5)

## 2021-01-08 LAB — BASIC METABOLIC PANEL
BUN: 14 mg/dL (ref 6–23)
CO2: 23 mEq/L (ref 19–32)
Calcium: 9.7 mg/dL (ref 8.4–10.5)
Chloride: 103 mEq/L (ref 96–112)
Creatinine, Ser: 0.82 mg/dL (ref 0.40–1.20)
GFR: 93.7 mL/min (ref >60.00)
Glucose, Bld: 171 mg/dL — ABNORMAL HIGH (ref 70–99)
Potassium: 4.1 mEq/L (ref 3.5–5.1)
Sodium: 137 mEq/L (ref 135–145)

## 2021-01-08 LAB — HEPATIC FUNCTION PANEL
ALT: 9 U/L (ref 0–35)
AST: 14 U/L (ref 0–37)
Albumin: 3.9 g/dL (ref 3.5–5.2)
Alkaline Phosphatase: 88 U/L (ref 39–117)
Bilirubin, Direct: 0.1 mg/dL (ref 0.0–0.3)
Total Bilirubin: 0.3 mg/dL (ref 0.2–1.2)
Total Protein: 6.9 g/dL (ref 6.0–8.3)

## 2021-01-08 LAB — LIPID PANEL
Cholesterol: 173 mg/dL (ref 0–200)
HDL: 66.9 mg/dL (ref >39.00)
LDL Cholesterol: 84 mg/dL (ref 0–99)
NonHDL: 106.09
Total CHOL/HDL Ratio: 3
Triglycerides: 109 mg/dL (ref 0.0–149.0)
VLDL: 21.8 mg/dL (ref 0.0–40.0)

## 2021-01-08 LAB — TSH: TSH: 2.6 u[IU]/mL (ref 0.35–4.50)

## 2021-01-08 MED ORDER — LOSARTAN POTASSIUM 25 MG PO TABS: 25.0000 mg | ORAL_TABLET | Freq: Every day | ORAL | 3 refills | Status: DC

## 2021-01-08 NOTE — Assessment & Plan Note (Signed)
Ongoing issue for pt.  Discussed need for healthy diet and regular exercise.  Will continue to follow.

## 2021-01-08 NOTE — Assessment & Plan Note (Signed)
Chronic problem.  On Lipitor 20mg nightly w/o difficulty.  Check labs.  Adjust meds prn  °

## 2021-01-08 NOTE — Progress Notes (Signed)
   Subjective:    Patient ID: Kristen Carrillo, female    DOB: [birth year], 35 y.o.   MRN: [id number]  HPI Hyperlipidemia- chronic problem, on Lipitor 20mg  nightly.  No abd pain.  Mild nausea, no vomiting- pt attributes this to stress  Elevated BP- this is not the first time pt's BP has been elevated.  At CPE in December was 122/82.  Pt reports increased stress recently but she feels this is her baseline- 'always wound up about something'.  No CP, SOB, HAs, visual changes, edema.  Obesity- weight and BMI are stable since December.  No regular exercise.  Attempting to follow low carb diet.   Review of Systems For ROS see HPI   This visit occurred during the SARS-CoV-2 public health emergency.  Safety protocols were in place, including screening questions prior to the visit, additional usage of staff PPE, and extensive cleaning of exam room while observing appropriate contact time as indicated for disinfecting solutions.      Objective:   Physical Exam Vitals reviewed.  Constitutional:      General: She is not in acute distress.    Appearance: Normal appearance. She is well-developed. She is obese.  HENT:     Head: Normocephalic and atraumatic.  Eyes:     Conjunctiva/sclera: Conjunctivae normal.     Pupils: Pupils are equal, round, and reactive to light.  Neck:     Thyroid: No thyromegaly.  Cardiovascular:     Rate and Rhythm: Normal rate and regular rhythm.     Pulses: Normal pulses.     Heart sounds: Normal heart sounds. No murmur heard. Pulmonary:     Effort: Pulmonary effort is normal. No respiratory distress.     Breath sounds: Normal breath sounds.  Abdominal:     General: There is no distension.     Palpations: Abdomen is soft.     Tenderness: There is no abdominal tenderness.  Musculoskeletal:     Cervical back: Normal range of motion and neck supple.     Right lower leg: No edema.     Left lower leg: No edema.  Lymphadenopathy:     Cervical: No cervical adenopathy.   Skin:    General: Skin is warm and dry.  Neurological:     General: No focal deficit present.     Mental Status: She is alert and oriented to person, place, and time.  Psychiatric:        Behavior: Behavior normal.          Assessment & Plan:  Elevated BP- new to provider.  Last visit to Endo BP was also elevated.  Given that she has type I DM, it would make sense to start ACE/ARB to protect her kidneys while lowering her BP.  Pt agreeable to start medication.  Will follow.

## 2021-01-08 NOTE — Patient Instructions (Signed)
Follow up in 3-4 weeks to recheck BP We'll notify you of your lab results and make any changes if needed START the Losartan once daily to lower BP and protect your kidneys Continue to work on healthy diet and regular exercise- you can do it! Call with any questions or concerns Hang in there!!!

## 2021-01-09 ENCOUNTER — Encounter: Payer: Self-pay | Admitting: *Deleted

## 2021-01-28 ENCOUNTER — Ambulatory Visit: Payer: BC Managed Care – PPO | Admitting: Family Medicine

## 2021-01-28 ENCOUNTER — Encounter: Payer: Self-pay | Admitting: Family Medicine

## 2021-01-28 ENCOUNTER — Other Ambulatory Visit: Payer: Self-pay

## 2021-01-28 DIAGNOSIS — I10 Essential (primary) hypertension: Secondary | ICD-10-CM | POA: Diagnosis not present

## 2021-01-28 LAB — BASIC METABOLIC PANEL
BUN: 15 mg/dL (ref 6–23)
CO2: 22 mEq/L (ref 19–32)
Calcium: 9.3 mg/dL (ref 8.4–10.5)
Chloride: 102 mEq/L (ref 96–112)
Creatinine, Ser: 0.71 mg/dL (ref 0.40–1.20)
GFR: 111.34 mL/min (ref >60.00)
Glucose, Bld: 253 mg/dL — ABNORMAL HIGH (ref 70–99)
Potassium: 4.1 mEq/L (ref 3.5–5.1)
Sodium: 135 mEq/L (ref 135–145)

## 2021-01-28 NOTE — Patient Instructions (Signed)
Schedule your complete physical in December We'll notify you of your lab results and make any changes if needed The BP looks great!  No changes at this time! Call with any questions or concerns Have a great summer!!!

## 2021-01-28 NOTE — Assessment & Plan Note (Signed)
New at last visit.  Tolerating Losartan w/o difficulty and BP is now at goal.  This will also protect kidneys in setting of diabetes.  Check BMP but no anticipated med changes.  Will follow.

## 2021-01-28 NOTE — Progress Notes (Signed)
   Subjective:    Patient ID: Kristen Carrillo, female    DOB: 1986/05/02, 35 y.o.   MRN: [id number]  HPI HTN- pt's BP was elevated at last visit.  This was after it was elevated at Endo.  She was started on Losartan 25mg  daily to both improve BP and protect kidneys.  Pt has not noticed any difference in how she is feeling since starting meds.  No CP, SOB, HAs, visual changes, edema.   Review of Systems For ROS see HPI   This visit occurred during the SARS-CoV-2 public health emergency.  Safety protocols were in place, including screening questions prior to the visit, additional usage of staff PPE, and extensive cleaning of exam room while observing appropriate contact time as indicated for disinfecting solutions.      Objective:   Physical Exam Vitals reviewed.  Constitutional:      General: She is not in acute distress.    Appearance: Normal appearance. She is well-developed.  HENT:     Head: Normocephalic and atraumatic.  Eyes:     Conjunctiva/sclera: Conjunctivae normal.     Pupils: Pupils are equal, round, and reactive to light.  Neck:     Thyroid: No thyromegaly.  Cardiovascular:     Rate and Rhythm: Normal rate and regular rhythm.     Pulses: Normal pulses.     Heart sounds: Normal heart sounds. No murmur heard. Pulmonary:     Effort: Pulmonary effort is normal. No respiratory distress.     Breath sounds: Normal breath sounds.  Abdominal:     General: There is no distension.     Palpations: Abdomen is soft.     Tenderness: There is no abdominal tenderness.  Musculoskeletal:     Cervical back: Normal range of motion and neck supple.     Right lower leg: No edema.     Left lower leg: No edema.  Lymphadenopathy:     Cervical: No cervical adenopathy.  Skin:    General: Skin is warm and dry.  Neurological:     Mental Status: She is alert and oriented to person, place, and time.  Psychiatric:        Behavior: Behavior normal.          Assessment & Plan:

## 2021-02-26 ENCOUNTER — Ambulatory Visit: Payer: BC Managed Care – PPO | Admitting: Internal Medicine

## 2021-02-26 ENCOUNTER — Encounter: Payer: Self-pay | Admitting: Internal Medicine

## 2021-02-26 ENCOUNTER — Other Ambulatory Visit: Payer: Self-pay

## 2021-02-26 VITALS — BP 120/88 | HR 123 | Ht 68.5 in | Wt 224.4 lb

## 2021-02-26 DIAGNOSIS — E669 Obesity, unspecified: Secondary | ICD-10-CM | POA: Diagnosis not present

## 2021-02-26 DIAGNOSIS — E1069 Type 1 diabetes mellitus with other specified complication: Secondary | ICD-10-CM

## 2021-02-26 DIAGNOSIS — E10649 Type 1 diabetes mellitus with hypoglycemia without coma: Secondary | ICD-10-CM | POA: Diagnosis not present

## 2021-02-26 DIAGNOSIS — E785 Hyperlipidemia, unspecified: Secondary | ICD-10-CM

## 2021-02-26 LAB — POCT GLYCOSYLATED HEMOGLOBIN (HGB A1C): Hemoglobin A1C: 7.7 % — AB (ref 4.0–5.6)

## 2021-02-26 MED ORDER — FIASP 100 UNIT/ML IJ SOLN: INTRAMUSCULAR | 3 refills | Status: DC

## 2021-02-26 NOTE — Patient Instructions (Addendum)
Please change: - basal rates: 12 am: 1.8 >> 1.9 5 am:  1.9 >> 2.0 8 am: 1.8 - ICR:  12 am: 6.0 - target: 110-110 - ISF: 40, except 50 between 12 am-4 pm - Insulin on Board: 3.5 h   Try to change the transmitter.  Try to stay in the auto mode as much as possible.   Calibrate the sensor 4x a day, including at bedtime.  Please return in 4 months.
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
## 2021-03-13 ENCOUNTER — Telehealth: Payer: Self-pay

## 2021-03-13 ENCOUNTER — Telehealth: Payer: Self-pay | Admitting: Internal Medicine

## 2021-03-13 NOTE — Telephone Encounter (Signed)
Mae with [facility] [employer] requests to be called at ph# [phone] re: Status of Detailed Written Order request that was sent to Fax# [phone] on 03/06/21 for the Guardian Transmitter Patient Account# [id number] Fax# [phone]

## 2021-03-13 NOTE — Telephone Encounter (Signed)
Form completed and faxed to [facility]

## 2021-03-13 NOTE — Telephone Encounter (Signed)
Inbound fax requesting forms be completed and faxed with recent clinical notes. Forms completed and faxed to [phone]

## 2021-03-20 ENCOUNTER — Other Ambulatory Visit: Payer: Self-pay | Admitting: Family Medicine

## 2021-03-28 ENCOUNTER — Telehealth: Payer: Self-pay

## 2021-03-28 NOTE — Telephone Encounter (Signed)
na

## 2021-03-28 NOTE — Telephone Encounter (Signed)
Inbound fax requesting forms be completed and faxed. Forms completed and faxed to [employer] at [phone].  

## 2021-04-02 ENCOUNTER — Encounter: Payer: Self-pay | Admitting: Internal Medicine

## 2021-04-29 ENCOUNTER — Other Ambulatory Visit: Payer: Self-pay | Admitting: Family Medicine

## 2021-06-16 ENCOUNTER — Other Ambulatory Visit: Payer: Self-pay | Admitting: Internal Medicine

## 2021-07-02 ENCOUNTER — Other Ambulatory Visit: Payer: Self-pay

## 2021-07-02 ENCOUNTER — Ambulatory Visit: Payer: BC Managed Care – PPO | Admitting: Internal Medicine

## 2021-07-02 ENCOUNTER — Encounter: Payer: Self-pay | Admitting: Internal Medicine

## 2021-07-02 VITALS — BP 120/82 | HR 133 | Ht 68.5 in | Wt 227.8 lb

## 2021-07-02 DIAGNOSIS — E669 Obesity, unspecified: Secondary | ICD-10-CM

## 2021-07-02 DIAGNOSIS — E1069 Type 1 diabetes mellitus with other specified complication: Secondary | ICD-10-CM

## 2021-07-02 DIAGNOSIS — E10649 Type 1 diabetes mellitus with hypoglycemia without coma: Secondary | ICD-10-CM | POA: Diagnosis not present

## 2021-07-02 DIAGNOSIS — E785 Hyperlipidemia, unspecified: Secondary | ICD-10-CM

## 2021-07-02 LAB — POCT GLYCOSYLATED HEMOGLOBIN (HGB A1C): Hemoglobin A1C: 8.6 % — AB (ref 4.0–5.6)

## 2021-07-02 MED ORDER — OZEMPIC (0.25 OR 0.5 MG/DOSE) 2 MG/1.5ML ~~LOC~~ SOPN: 0.5000 mg | PEN_INJECTOR | SUBCUTANEOUS | 3 refills | Status: DC
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
## 2021-07-02 NOTE — Patient Instructions (Addendum)
°  Please start: - Ozempic 0.25 mg weekly in a.m. (for example on Sunday morning) x 4 weeks, then increase to 0.5 mg weekly in a.m. if no nausea or hypoglycemia.  Please use the following settings: - basal rates: 12 am: 1.9 5 am:  2.0 8 am: 1.8   11 am: 1.8 >> 1.95 2 pm: 1.8 4 pm: 1.8 >> 1.95 10 pm: 1.8 - ICR:  12 am: 6.0 >> 5.0 - target: 110-110 - ISF: 40, except 50 between 12 am-4 pm - Insulin on Board: 3.5 h    Please return in 4 months.

## 2021-07-12 ENCOUNTER — Encounter: Payer: Self-pay | Admitting: Family Medicine

## 2021-07-12 ENCOUNTER — Ambulatory Visit (INDEPENDENT_AMBULATORY_CARE_PROVIDER_SITE_OTHER): Payer: BC Managed Care – PPO | Admitting: Family Medicine

## 2021-07-12 VITALS — BP 124/78 | HR 93 | Temp 97.5°F | Ht 68.11 in | Wt 225.0 lb

## 2021-07-12 DIAGNOSIS — Z0000 Encounter for general adult medical examination without abnormal findings: Secondary | ICD-10-CM | POA: Diagnosis not present

## 2021-07-12 DIAGNOSIS — E559 Vitamin D deficiency, unspecified: Secondary | ICD-10-CM

## 2021-07-12 DIAGNOSIS — E669 Obesity, unspecified: Secondary | ICD-10-CM

## 2021-07-12 LAB — BASIC METABOLIC PANEL
BUN: 10 mg/dL (ref 6–23)
CO2: 23 mEq/L (ref 19–32)
Calcium: 8.9 mg/dL (ref 8.4–10.5)
Chloride: 101 mEq/L (ref 96–112)
Creatinine, Ser: 0.71 mg/dL (ref 0.40–1.20)
GFR: 110.98 mL/min (ref >60.00)
Glucose, Bld: 258 mg/dL — ABNORMAL HIGH (ref 70–99)
Potassium: 4.4 mEq/L (ref 3.5–5.1)
Sodium: 135 mEq/L (ref 135–145)

## 2021-07-12 LAB — CBC WITH DIFFERENTIAL/PLATELET
Basophils Absolute: 0 10*3/uL (ref 0.0–0.1)
Basophils Relative: 0.5 % (ref 0.0–3.0)
Eosinophils Absolute: 0.3 10*3/uL (ref 0.0–0.7)
Eosinophils Relative: 3.4 % (ref 0.0–5.0)
HCT: 40.3 % (ref 36.0–46.0)
Hemoglobin: 13.6 g/dL (ref 12.0–15.0)
Lymphocytes Relative: 33.2 % (ref 12.0–46.0)
Lymphs Abs: 2.5 10*3/uL (ref 0.7–4.0)
MCHC: 33.8 g/dL (ref 30.0–36.0)
MCV: 82 fl (ref 78.0–100.0)
Monocytes Absolute: 0.3 10*3/uL (ref 0.1–1.0)
Monocytes Relative: 4.6 % (ref 3.0–12.0)
Neutro Abs: 4.3 10*3/uL (ref 1.4–7.7)
Neutrophils Relative %: 58.3 % (ref 43.0–77.0)
Platelets: 377 10*3/uL (ref 150.0–400.0)
RBC: 4.91 Mil/uL (ref 3.87–5.11)
RDW: 13 % (ref 11.5–15.5)
WBC: 7.5 10*3/uL (ref 4.0–10.5)

## 2021-07-12 LAB — HEPATIC FUNCTION PANEL
ALT: 7 U/L (ref 0–35)
AST: 9 U/L (ref 0–37)
Albumin: 3.6 g/dL (ref 3.5–5.2)
Alkaline Phosphatase: 87 U/L (ref 39–117)
Bilirubin, Direct: 0.1 mg/dL (ref 0.0–0.3)
Total Bilirubin: 0.5 mg/dL (ref 0.2–1.2)
Total Protein: 6.6 g/dL (ref 6.0–8.3)

## 2021-07-12 LAB — VITAMIN D 25 HYDROXY (VIT D DEFICIENCY, FRACTURES): VITD: 72.03 ng/mL (ref 30.00–100.00)

## 2021-07-12 LAB — LIPID PANEL
Cholesterol: 160 mg/dL (ref 0–200)
HDL: 65.6 mg/dL (ref >39.00)
LDL Cholesterol: 82 mg/dL (ref 0–99)
NonHDL: 94.48
Total CHOL/HDL Ratio: 2
Triglycerides: 61 mg/dL (ref 0.0–149.0)
VLDL: 12.2 mg/dL (ref 0.0–40.0)

## 2021-07-12 LAB — TSH: TSH: 2.46 u[IU]/mL (ref 0.35–5.50)

## 2021-07-12 NOTE — Assessment & Plan Note (Signed)
Pt's PE WNL w/ exception of obesity.  UTD on Tdap.  Eye exam scheduled.  Due for pap w/ GYN.  Check labs.  Anticipatory guidance provided.

## 2021-07-12 NOTE — Patient Instructions (Addendum)
Follow up in 6 months to recheck BP and cholesterol We'll notify you of your lab results and make any changes if needed Continue to work on healthy diet and regular exercise- you can do it! Have your eye doctor send me a copy of their report so I can update your chart Call with any questions or concerns Stay Safe!  Stay Healthy! Happy New Year!!

## 2021-07-12 NOTE — Assessment & Plan Note (Signed)
Pt's BMI is 34.1  Encouraged healthy diet and regular exercise.  Will continue to follow.

## 2021-07-12 NOTE — Assessment & Plan Note (Signed)
Check labs and replete prn. 

## 2021-07-12 NOTE — Progress Notes (Signed)
° °  Subjective:    Patient ID: Kristen Carrillo, female    DOB: Jan 08, 1986, 35 y.o.   MRN: [id number]  HPI CPE- UTD on Tdap.  Due for foot exam, eye exam- scheduled for Jan 11  Patient Care Team    Relationship Specialty Notifications Start End  Sheliah Hatch, MD PCP - General Family Medicine  10/11/15   Carrington Clamp, MD Consulting Physician Obstetrics and Gynecology  10/11/15   Carlus Pavlov, MD Consulting Physician Internal Medicine  10/11/15   Estrella Deeds, OD Physician Assistant Optometry  10/11/15   Alferd Patee, MD Referring Physician Anesthesiology  10/11/15   Ellis Savage, NP Nurse Practitioner   10/11/15     Health Maintenance  Topic Date Due   Pneumococcal Vaccine 89-14 Years old (1 - PCV) Never done   Hepatitis C Screening  Never done   INFLUENZA VACCINE  02/11/2021   PAP SMEAR-Modifier  04/13/2021   FOOT EXAM  05/02/2021   OPHTHALMOLOGY EXAM  07/02/2021   COVID-19 Vaccine (3 - Booster for Pfizer series) 07/13/2021 (Originally 11/23/2019)   HEMOGLOBIN A1C  12/31/2021   TETANUS/TDAP  07/15/2023   HPV VACCINES  Completed   HIV Screening  Completed      Review of Systems Patient reports no vision/ hearing changes, adenopathy,fever, weight change,  persistant/recurrent hoarseness , swallowing issues, chest pain, palpitations, edema, persistant/recurrent cough, hemoptysis, dyspnea (rest/exertional/paroxysmal nocturnal), gastrointestinal bleeding (melena, rectal bleeding), abdominal pain, significant heartburn, bowel changes, GU symptoms (dysuria, hematuria, incontinence), Gyn symptoms (abnormal  bleeding, pain),  syncope, focal weakness, memory loss, numbness & tingling, skin/hair changes, abnormal bruising or bleeding, anxiety, or depression.   Nail on L 4th finger is pitted/dimpled  This visit occurred during the SARS-CoV-2 public health emergency.  Safety protocols were in place, including screening questions prior to the visit, additional usage of staff PPE, and  extensive cleaning of exam room while observing appropriate contact time as indicated for disinfecting solutions.      Objective:   Physical Exam General Appearance:    Alert, cooperative, no distress, appears stated age  Head:    Normocephalic, without obvious abnormality, atraumatic  Eyes:    PERRL, conjunctiva/corneas clear, EOM's intact, fundi    benign, both eyes  Ears:    Normal TM's and external ear canals, both ears  Nose:   Deferred due to COVID  Throat:   Neck:   Supple, symmetrical, trachea midline, no adenopathy;    Thyroid: no enlargement/tenderness/nodules  Back:     Symmetric, no curvature, ROM normal, no CVA tenderness  Lungs:     Clear to auscultation bilaterally, respirations unlabored  Chest Wall:    No tenderness or deformity   Heart:    Regular rate and rhythm, S1 and S2 normal, no murmur, rub   or gallop  Breast Exam:    Deferred to GYN  Abdomen:     Soft, non-tender, bowel sounds active all four quadrants,    no masses, no organomegaly  Genitalia:    Deferred to GYN  Rectal:    Extremities:   Extremities normal, atraumatic, no cyanosis or edema  Pulses:   2+ and symmetric all extremities  Skin:   Skin color, texture, turgor normal, no rashes or lesions  Lymph nodes:   Cervical, supraclavicular, and axillary nodes normal  Neurologic:   CNII-XII intact, normal strength, sensation and reflexes    throughout          Assessment & Plan:

## 2021-07-19 ENCOUNTER — Other Ambulatory Visit: Payer: Self-pay | Admitting: Family Medicine

## 2021-08-15 ENCOUNTER — Telehealth: Payer: Self-pay

## 2021-08-15 NOTE — Telephone Encounter (Signed)
Inbound fax requesting forms be completed. Forms completed and faxed to [employer] at [phone]  

## 2021-08-28 ENCOUNTER — Other Ambulatory Visit: Payer: Self-pay | Admitting: Family Medicine

## 2021-09-09 NOTE — Telephone Encounter (Signed)
Written order form received from [employer]. Completed and signed and faxed to [phone]

## 2021-09-17 ENCOUNTER — Telehealth: Payer: Self-pay | Admitting: Nutrition

## 2021-09-17 NOTE — Telephone Encounter (Signed)
Pt. Left message on my phone.  LVM to her to schedule Tandem pump training. ?

## 2021-09-19 ENCOUNTER — Telehealth: Payer: Self-pay | Admitting: Dietician

## 2021-09-19 LAB — HM DIABETES EYE EXAM

## 2021-09-19 NOTE — Telephone Encounter (Signed)
Patient called to arrange Tandem pump training.  Discussed that Bonita Quin will return approximately 3/20 and will return her call that week.  An end of the day appointment on Wednesday is best. ? ?Message forwarded to [facility]. ?Oran Rein, RD, LDN, CDCES ? ?

## 2021-09-28 ENCOUNTER — Other Ambulatory Visit: Payer: Self-pay | Admitting: Internal Medicine

## 2021-10-07 NOTE — Telephone Encounter (Signed)
LVM to call me to schedule pump training 

## 2021-10-15 NOTE — Telephone Encounter (Signed)
LVM to call me to schedule pump start 

## 2021-10-23 ENCOUNTER — Encounter: Payer: BC Managed Care – PPO | Attending: Internal Medicine | Admitting: Nutrition

## 2021-10-23 DIAGNOSIS — E10649 Type 1 diabetes mellitus with hypoglycemia without coma: Secondary | ICD-10-CM | POA: Insufficient documentation

## 2021-10-23 NOTE — Patient Instructions (Signed)
Patient was trained on the Tandem insulin pump.  Settings were transferred from her Medtonic 670G pump: Basal rate: MN: 1.9, 5AM: 2.0, 8AM: 1.8, 11AM: 1.95, 2PM: 1.8, 4PM: 1.95, 10PM: 1.8     I/C: 5,  ISF: MN: 50, 4PM: 40, timing 3.5 hr., target 110.  She was trained on the use of the Dexcom and this was started and linked to her pump, [place] [facility], and t-Connect app.  Pump was also linked to [place]. ?She was show how to bolus, and given handouts on high blood sugar protocols, how to start/stop pump, bolus, change pump settings, sick day guidelines and give a bolus from her phone and Control IQ symbols on her pump.  She had no final questions.  She signed the checklist of topics and had no final questions. ?

## 2021-11-05 ENCOUNTER — Telehealth: Payer: Self-pay | Admitting: Nutrition

## 2021-11-06 NOTE — Progress Notes (Shared)
?[facility] Note ? ?11/19/2021 ? ?  ? ?CHIEF COMPLAINT ?Patient presents for No chief complaint on file. ? ? ?HISTORY OF PRESENT ILLNESS: ?Kristen Carrillo is a 36 y.o. female who presents to the clinic today for:  ? ? ?pt states vision has been stable ? ?Referring physician: ?Midge Minium, MD ?[address] ?[place],  [place] [address] ? ?HISTORICAL INFORMATION:  ? ?Selected notes from the [facility] ?Referred by Dr. Wyatt Portela for concern of lattice degeneration OU and atrophic holes OD ?LEE: 09.03.19 (S. Groat) [BCVA: OD: 20/25 OS:20/25] ?Ocular Hx-high myopia ?PMH-anxiety, depression, OCD, DM1 (takes novolog) ?  ? ?CURRENT MEDICATIONS: ?No current outpatient medications on file. (Ophthalmic Drugs)  ? ?No current facility-administered medications for this visit. (Ophthalmic Drugs)  ? ?Current Outpatient Medications (Other)  ?Medication Sig  ? amphetamine-dextroamphetamine (ADDERALL) 20 MG tablet 2 tablets in the morning and 1 in the evening.  ? atorvastatin (LIPITOR) 20 MG tablet TAKE 1 TABLET BY MOUTH EVERY DAY  ? Cholecalciferol (VITAMIN D3) 5000 units CAPS Take 1 capsule by mouth daily.  ? famotidine (PEPCID) 40 MG tablet TAKE 1 TABLET BY MOUTH EVERY DAY  ? FIASP 100 UNIT/ML SOLN INJECT 80 UNITS INTO THE SKIN DAILY. USE IN INSULIN PUMP  ? Fluvoxamine Maleate 150 MG CP24 Take 2 capsules by mouth at bedtime.   ? glucagon (GLUCAGON EMERGENCY) 1 MG injection Glucagon Emergency Kit (human-recomb) 1 mg solution for injection  ? Glucagon 3 MG/DOSE POWD Place 3 mg into the nose once as needed for up to 1 dose.  ? glucose blood (CONTOUR NEXT TEST) test strip Use as instructed to test 3 times daily  ? Insulin Pen Needle 32G X 4 MM MISC Use 1-4x a day  ? LORazepam (ATIVAN) 1 MG tablet   ? losartan (COZAAR) 25 MG tablet TAKE 1 TABLET (25 MG TOTAL) BY MOUTH DAILY.  ? NUVARING 0.12-0.015 MG/24HR vaginal ring INSERT 1 RING VAGINALLY AS DIRECTED. REMOVE AFTER 3 WEEKS & WAIT 7  DAYS BEFORE INSERTING A NEW RING  ? Semaglutide,0.25 or 0.5MG /DOS, (OZEMPIC, 0.25 OR 0.5 MG/DOSE,) 2 MG/1.5ML SOPN Inject 0.5 mg into the skin once a week.  ? Urine Glucose-Ketones Test STRP Use 1-3 times daily as needed  ? WELLBUTRIN XL 300 MG 24 hr tablet   ? ?No current facility-administered medications for this visit. (Other)  ? ? ? ? ?REVIEW OF SYSTEMS: ? ? ? ? ?ALLERGIES ?Allergies  ?Allergen Reactions  ? Adhesive [Tape] Rash  ? Colophony [Pinus Strobus] Hives and Rash  ? Formaldehyde Hives and Rash  ?  Like a burn  ? Latex Rash  ? Nickel Hives and Rash  ? Sulfa Antibiotics Rash  ? ? ?PAST MEDICAL HISTORY ?Past Medical History:  ?Diagnosis Date  ? Anxiety   ? Depression   ? Diabetes mellitus without complication ([place])   ? DMT1 age 6 Dr. Benjiman Core Velora Heckler)  ? Pneumonia   ? 2005  ? ?Past Surgical History:  ?Procedure Laterality Date  ? CERVICAL POLYPECTOMY N/A 04/25/2016  ? Procedure: CERVICAL POLYPECTOMY;  Surgeon: Bobbye Charleston, MD;  Location: [facility];  Service: Gynecology;  Laterality: N/A;  ? HYSTEROSCOPY WITH D & C N/A 04/25/2016  ? Procedure: DILATATION AND CURETTAGE /HYSTEROSCOPY;  Surgeon: Bobbye Charleston, MD;  Location: [facility];  Service: Gynecology;  Laterality: N/A;  ? MULTIPLE TOOTH EXTRACTIONS    ? RETINAL LASER PROCEDURE    ? TYMPANOSTOMY TUBE PLACEMENT  age 22  ? ? ?FAMILY HISTORY ?Family History  ?Problem Relation Age of Onset  ? Diabetes Mother   ? Hypertension Mother   ? Hyperlipidemia Mother   ? Thyroid disease Mother   ? Heart disease Mother   ? Diabetes Maternal Uncle   ? Hypertension Maternal Uncle   ? Hypertension Maternal Grandmother   ? Heart disease Maternal Grandmother   ? Diabetes Maternal Grandfather   ? Hypertension Maternal Grandfather   ? Hyperlipidemia Maternal Grandfather   ? ? ?SOCIAL HISTORY ?Social History  ? ?Tobacco Use  ? Smoking status: Former  ?  Types: Cigarettes  ?  Quit date: 07/14/2009  ?  Years since quitting: 12.3  ? Smokeless tobacco: Never  ?Vaping Use  ?  Vaping Use: Never used  ?Substance Use Topics  ? Alcohol use: Yes  ?  Alcohol/week: 0.0 standard drinks  ?  Comment: occasionally  ? Drug use: No  ? ?  ? ?  ? ?OPHTHALMIC EXAM: ? ?Not recorded ?  ? ? ?IMAGING AND PROCEDURES  ?Imaging and Procedures for $RemoveBefore'@TODAY'[id number]$ @ ? ? ? ? ?  ?  ? ?  ?ASSESSMENT/PLAN: ? ?  ICD-10-CM   ?1. Bilateral retinal lattice degeneration  H35.413   ?  ?2. Retinal holes, bilateral  H33.323   ?  ?3. Type 1 diabetes mellitus without retinopathy ([place])  E10.9   ?  ?4. High myopia, bilateral  H52.13   ?  ? ? ? ?1,2. Lattice degeneration with atrophic retinal holes, OU -  ? - GP:QDIYMEBR lattice at ora, peripheral lattice SN quadrant, VR tuft at 530 equator OD ? - new lattice with atrophic holes at 1000 OD noted 01.15.21 ? - OS: superior lattice from 1000 to 0200, lattice from 0500 to 0700 OS ? - S/P laser retinopexy OD (10.04.19, 01.15.21) -- good laser surrounding lattice ? - S/P laser retinopexy OS (11.01.19) -- good laser in place ? - f/u 1 year ? ?3. Diabetes mellitus, type 1 without retinopathy ? - last A1c was 7.6 on 03.29.22 ? - The incidence, risk factors for progression, natural history and treatment options for diabetic retinopathy  were discussed with patient.   ? - The need for close monitoring of blood glucose, blood pressure, and serum lipids, avoiding cigarette or any type of tobacco, and the need for long term follow up was also discussed with patient. ? - f/u in 1 year, sooner prn ?-  No retinal edema on exam or OCT ? ?4. High Myopia OU-  ? - discussed association of myopia with lattice degen and RT/RD ? - doing well with current specs ? - under the expert care of Dr. Katy Fitch ? ?Ophthalmic Meds Ordered this visit:  ?No orders of the defined types were placed in this encounter. ? ? ?  ? ?No follow-ups on file. ? ?There are no Patient Instructions on file for this visit. ? ? ?Explained the diagnoses, plan, and follow up with the patient and they expressed understanding.  Patient expressed  understanding of the importance of proper follow up care.  ? ?This document serves as a record of services personally performed by Gardiner Sleeper, MD, PhD. It was created on their behalf by Annie Paras, COT, an ophthalmic technician. The creation of this record is the provider's dictation and/or activities during the visit.   ? ?Electronically signed by: Annie Paras, COT 11/06/21 10:56 AM  ? ? ?Gardiner Sleeper, M.D., Ph.D. ?Diseases & Surgery of the Retina and  Vitreous ?[place] ?11/16/2019  ? ? ? ?Abbreviations: ?M myopia (nearsighted); A astigmatism; H hyperopia (farsighted); P presbyopia; Mrx spectacle prescription;  CTL contact lenses; OD right eye; OS left eye; OU both eyes  XT exotropia; ET esotropia; PEK punctate epithelial keratitis; PEE punctate epithelial erosions; DES dry eye syndrome; MGD meibomian gland dysfunction; ATs artificial tears; PFAT's preservative free artificial tears; [place] nuclear sclerotic cataract; PSC posterior subcapsular cataract; ERM epi-retinal membrane; PVD posterior vitreous detachment; RD retinal detachment; DM diabetes mellitus; DR diabetic retinopathy; NPDR non-proliferative diabetic retinopathy; PDR proliferative diabetic retinopathy; CSME clinically significant macular edema; DME diabetic macular edema; dbh dot blot hemorrhages; CWS cotton wool spot; POAG primary open angle glaucoma; C/D cup-to-disc ratio; HVF humphrey visual field; GVF goldmann visual field; OCT optical coherence tomography; IOP intraocular pressure; BRVO Branch retinal vein occlusion; CRVO central retinal vein occlusion; CRAO central retinal artery occlusion; BRAO branch retinal artery occlusion; RT retinal tear; SB scleral buckle; PPV pars plana vitrectomy; VH Vitreous hemorrhage; PRP panretinal laser photocoagulation; IVK intravitreal kenalog; VMT vitreomacular traction; MH Macular hole;  NVD neovascularization of the disc; NVE neovascularization elsewhere; AREDS  age related eye disease study; ARMD age related macular degeneration; POAG primary open angle glaucoma; EBMD epithelial/anterior basement membrane dystrophy; ACIOL anterior chamber intraocular lens; IOL in
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
## 2021-11-14 ENCOUNTER — Encounter: Payer: Self-pay | Admitting: Internal Medicine

## 2021-11-14 ENCOUNTER — Ambulatory Visit: Payer: BC Managed Care – PPO | Admitting: Internal Medicine

## 2021-11-14 VITALS — BP 120/68 | HR 111 | Ht 68.11 in | Wt 228.4 lb

## 2021-11-14 DIAGNOSIS — E1069 Type 1 diabetes mellitus with other specified complication: Secondary | ICD-10-CM

## 2021-11-14 DIAGNOSIS — E785 Hyperlipidemia, unspecified: Secondary | ICD-10-CM

## 2021-11-14 DIAGNOSIS — E669 Obesity, unspecified: Secondary | ICD-10-CM | POA: Diagnosis not present

## 2021-11-14 DIAGNOSIS — E10649 Type 1 diabetes mellitus with hypoglycemia without coma: Secondary | ICD-10-CM

## 2021-11-14 LAB — POCT GLYCOSYLATED HEMOGLOBIN (HGB A1C): Hemoglobin A1C: 7.4 % — AB (ref 4.0–5.6)

## 2021-11-14 MED ORDER — GLUCAGON 3 MG/DOSE NA POWD: 3.0000 mg | Freq: Once | NASAL | 11 refills | Status: DC | PRN

## 2021-11-14 NOTE — Patient Instructions (Addendum)
Please use the following settings: ?- basal rates: ?12 am: 1.9 ?5 am:  2.0 ?8 am: 1.8   ?11 am: 1.95 ?2 pm: 1.8 ?4 pm: 1.95 ?10 pm: 1.8 ?- ICR:  ?12 am: 5.0 ?4 am: 5 >> 4.5 ?11 am: 5.0 ?5 pm: 5 >> 4.5 ?- target: 110-110 ?- ISF: 40, except 50 between 12 am-4 pm ?- Insulin on Board: 5 >> 3h  ?  ?Please return in 4 months. ?

## 2021-11-19 ENCOUNTER — Telehealth: Payer: Self-pay | Admitting: Nutrition

## 2021-11-19 ENCOUNTER — Encounter (INDEPENDENT_AMBULATORY_CARE_PROVIDER_SITE_OTHER): Payer: BC Managed Care – PPO | Admitting: Ophthalmology

## 2021-11-19 DIAGNOSIS — H35413 Lattice degeneration of retina, bilateral: Secondary | ICD-10-CM

## 2021-11-19 DIAGNOSIS — E109 Type 1 diabetes mellitus without complications: Secondary | ICD-10-CM

## 2021-11-19 DIAGNOSIS — H33323 Round hole, bilateral: Secondary | ICD-10-CM

## 2021-11-19 DIAGNOSIS — H5213 Myopia, bilateral: Secondary | ICD-10-CM

## 2021-11-19 NOTE — Telephone Encounter (Signed)
Message left to call meTCM POST DISCHARGE CONTACT 3 OUTREACH    Care Management Intervention: Care Manager Intervention Completed       Risk of Admission or ED Visit  Current as of 2 days ago        2% 40 to 100%: High Risk   20 to < 40%: Medium Risk   0 to < 20%: Low Risk     No Change          This score indicates an adult patient's 1-year risk, as a percentage, of a hospital admission or ED visit.             Current PCP:  Dennis Oconnor, MD                  Spoke To:: spouse      Are you feeling as good as you did when you left the hospital?: Yes         Has home care agency contacted patient yet?: N/A      If equipment was ordered at time of discharge, does the patient have this in the home?: N/A       Is the patient aware of pending orders / testing? : N/A      Do you have any questions about your discharge instructions?: No      Do you have an appointment scheduled with your PCP and know the date and time of that appointment?: Yes      Do you have transportation to get to that appointment?: Yes      Do you have all of your medications listed on your discharge summary and are you taking them as they are written on the bottle?: Yes         Discharge medications reviewed and reconciled against outpatient medical record:: Yes      Do you have any questions or concerns about your medications?: No         If the patient is newly initiated on anticoagulation, are they aware who is managing this?: Yes (Eliquis was stopped and he was instructed to start ASA 81 mg)      Reviewed medications with:: spouse      Time spent on the call with the patient:: 10 minutes      Patient Care Management assessment and plan of care/next steps:: will continue to follow for TCM

## 2021-11-25 ENCOUNTER — Other Ambulatory Visit: Payer: Self-pay | Admitting: Internal Medicine

## 2021-12-19 ENCOUNTER — Encounter (INDEPENDENT_AMBULATORY_CARE_PROVIDER_SITE_OTHER): Payer: BC Managed Care – PPO | Admitting: Internal Medicine

## 2021-12-19 DIAGNOSIS — Z79899 Other long term (current) drug therapy: Secondary | ICD-10-CM

## 2021-12-20 MED ORDER — BASAGLAR KWIKPEN 100 UNIT/ML ~~LOC~~ SOPN: 35.0000 [IU] | PEN_INJECTOR | Freq: Every day | SUBCUTANEOUS | 3 refills | Status: DC

## 2021-12-20 MED ORDER — FIASP FLEXTOUCH 100 UNIT/ML ~~LOC~~ SOPN: 45.0000 [IU] | PEN_INJECTOR | Freq: Three times a day (TID) | SUBCUTANEOUS | 3 refills | Status: AC

## 2021-12-20 MED ORDER — INSULIN PEN NEEDLE 32G X 4 MM MISC: 3 refills | Status: AC

## 2021-12-20 NOTE — Telephone Encounter (Addendum)
Please see the MyChart message reply(ies) for my assessment and plan.  ?  ?This patient gave consent for this Medical Advice Message and is aware that it may result in a bill to their insurance company, as well as the possibility of receiving a bill for a co-payment or deductible. They are an established patient, but are not seeking medical advice exclusively about a problem treated during an in person or video visit in the last seven days. I did not recommend an in person or video visit within seven days of my reply.  ?  ?I spent a total of 10 minutes cumulative time within 7 days through MyChart messaging. ? ?Rafaela Dinius, MD   ?

## 2021-12-24 NOTE — Progress Notes (Shared)
[facility] - Clinic Note  12/27/2021     CHIEF COMPLAINT Patient presents for No chief complaint on file.   HISTORY OF PRESENT ILLNESS: Kristen Carrillo is a 36 y.o. female who presents to the clinic today for:    pt states vision has been stable  Referring physician: Sheliah Hatch, MD [address] [place],  [place] [address]  HISTORICAL INFORMATION:   Selected notes from the MEDICAL RECORD NUMBER Referred by Dr. Fabian Sharp for concern of lattice degeneration OU and atrophic holes OD LEE: 09.03.19 (S. Groat) [BCVA: OD: 20/25 OS:20/25] Ocular Hx-high myopia PMH-anxiety, depression, OCD, DM1 (takes novolog)    CURRENT MEDICATIONS: No current outpatient medications on file. (Ophthalmic Drugs)   No current facility-administered medications for this visit. (Ophthalmic Drugs)   Current Outpatient Medications (Other)  Medication Sig   amphetamine-dextroamphetamine (ADDERALL) 20 MG tablet 2 tablets in the morning and 1 in the evening.   atorvastatin (LIPITOR) 20 MG tablet TAKE 1 TABLET BY MOUTH EVERY DAY   Cholecalciferol (VITAMIN D3) 5000 units CAPS Take 1 capsule by mouth daily.   famotidine (PEPCID) 40 MG tablet TAKE 1 TABLET BY MOUTH EVERY DAY   FIASP 100 UNIT/ML SOLN INJECT 80 UNITS INTO THE SKIN DAILY. USE IN INSULIN PUMP   Fluvoxamine Maleate 150 MG CP24 Take 2 capsules by mouth at bedtime.    Glucagon 3 MG/DOSE POWD Place 3 mg into the nose once as needed for up to 1 dose.   glucose blood (CONTOUR NEXT TEST) test strip Use as instructed to test 3 times daily   insulin aspart (FIASP FLEXTOUCH) 100 UNIT/ML FlexTouch Pen Inject 45-55 Units into the skin 3 (three) times daily before meals.   Insulin Glargine (BASAGLAR KWIKPEN) 100 UNIT/ML Inject 35-40 Units into the skin at bedtime.   Insulin Pen Needle 32G X 4 MM MISC Use 4x a day   LORazepam (ATIVAN) 1 MG tablet    losartan (COZAAR) 25 MG tablet TAKE 1 TABLET (25 MG TOTAL) BY MOUTH DAILY.    NUVARING 0.12-0.015 MG/24HR vaginal ring INSERT 1 RING VAGINALLY AS DIRECTED. REMOVE AFTER 3 WEEKS & WAIT 7 DAYS BEFORE INSERTING A NEW RING   Semaglutide,0.25 or 0.5MG /DOS, (OZEMPIC, 0.25 OR 0.5 MG/DOSE,) 2 MG/1.5ML SOPN Inject 0.5 mg into the skin once a week. (Patient not taking: Reported on 11/14/2021)   Urine Glucose-Ketones Test STRP Use 1-3 times daily as needed   WELLBUTRIN XL 300 MG 24 hr tablet    No current facility-administered medications for this visit. (Other)      REVIEW OF SYSTEMS:     ALLERGIES Allergies  Allergen Reactions   Adhesive [Tape] Rash   Colophony [Pinus Strobus] Hives and Rash   Formaldehyde Hives and Rash    Like a burn   Latex Rash   Nickel Hives and Rash   Sulfa Antibiotics Rash    PAST MEDICAL HISTORY Past Medical History:  Diagnosis Date   Anxiety    Depression    Diabetes mellitus without complication [facility])    DMT1 age 60 Dr. Ernest Haber [facility])   Pneumonia    2005   Past Surgical History:  Procedure Laterality Date   CERVICAL POLYPECTOMY N/A 04/25/2016   Procedure: CERVICAL POLYPECTOMY;  Surgeon: Carrington Clamp, MD;  Location: [facility];  Service: Gynecology;  Laterality: N/A;   HYSTEROSCOPY WITH D & C N/A 04/25/2016   Procedure: DILATATION AND CURETTAGE /HYSTEROSCOPY;  Surgeon: Carrington Clamp, MD;  Location[facility]  ORS;  Service: Gynecology;  Laterality: N/A;   MULTIPLE TOOTH EXTRACTIONS     RETINAL LASER PROCEDURE     TYMPANOSTOMY TUBE PLACEMENT  age 89    FAMILY HISTORY Family History  Problem Relation Age of Onset   Diabetes Mother    Hypertension Mother    Hyperlipidemia Mother    Thyroid disease Mother    Heart disease Mother    Diabetes Maternal Uncle    Hypertension Maternal Uncle    Hypertension Maternal Grandmother    Heart disease Maternal Grandmother    Diabetes Maternal Grandfather    Hypertension Maternal Grandfather    Hyperlipidemia Maternal Grandfather     SOCIAL HISTORY Social History   Tobacco  Use   Smoking status: Former    Types: Cigarettes    Quit date: 07/14/2009    Years since quitting: 12.4   Smokeless tobacco: Never  Vaping Use   Vaping Use: Never used  Substance Use Topics   Alcohol use: Yes    Alcohol/week: 0.0 standard drinks of alcohol    Comment: occasionally   Drug use: No         OPHTHALMIC EXAM:  Not recorded     IMAGING AND PROCEDURES  Imaging and Procedures for @TODAY @            ASSESSMENT/PLAN:    ICD-10-CM   1. Bilateral retinal lattice degeneration  H35.413     2. Retinal holes, bilateral  H33.323     3. Type 1 diabetes mellitus without retinopathy (HCC)  E10.9        1,2. Lattice degeneration with atrophic retinal holes, OU -   - ZO:[id number]:superior lattice at ora, peripheral lattice SN quadrant, VR tuft at 530 equator OD  - new lattice with atrophic holes at 1000 OD noted 01.15.21  - OS: superior lattice from 1000 to 0200, lattice from 0500 to 0700 OS  - S/P laser retinopexy OD (10.04.19, 01.15.21) -- good laser surrounding lattice  - S/P laser retinopexy OS (11.01.19) -- good laser in place  - f/u 1 year  3. Diabetes mellitus, type 1 without retinopathy  - last A1c was 7.6 on 03.29.22  - The incidence, risk factors for progression, natural history and treatment options for diabetic retinopathy  were discussed with patient.    - The need for close monitoring of blood glucose, blood pressure, and serum lipids, avoiding cigarette or any type of tobacco, and the need for long term follow up was also discussed with patient.   - f/u in 1 year, sooner prn  4. High Myopia OU-   - discussed association of myopia with lattice degen and RT/RD  - doing well with current specs  - under the expert care of Dr. Dione BoozeGroat   Ophthalmic Meds Ordered this visit:  No orders of the defined types were placed in this encounter.    .  No follow-ups on file.  There are no Patient Instructions on file for this visit.   Explained the diagnoses,  plan, and follow up with the patient and they expressed understanding.  Patient expressed understanding of the importance of proper follow up care.   This document serves as a record of services personally performed by Karie ChimeraBrian G. Zamora, MD, PhD. It was created on their behalf by Joni Reiningobin Koa Zoeller, an ophthalmic technician. The creation of this record is the provider's dictation and/or activities during the visit.    Electronically signed by: Joni Reiningobin Roper Tolson COA, 12/24/21  8:17 AM    Isaias CowmanBrian G.  Vanessa Barbara, M.D., Ph.D. Diseases & Surgery of the Retina and Vitreous [facility]    Abbreviations: M myopia (nearsighted); A astigmatism; H hyperopia (farsighted); P presbyopia; Mrx spectacle prescription;  CTL contact lenses; OD right eye; OS left eye; OU both eyes  XT exotropia; ET esotropia; PEK punctate epithelial keratitis; PEE punctate epithelial erosions; DES dry eye syndrome; MGD meibomian gland dysfunction; ATs artificial tears; PFAT's preservative free artificial tears; NSC nuclear sclerotic cataract; PSC posterior subcapsular cataract; ERM epi-retinal membrane; PVD posterior vitreous detachment; RD retinal detachment; DM diabetes mellitus; DR diabetic retinopathy; NPDR non-proliferative diabetic retinopathy; PDR proliferative diabetic retinopathy; CSME clinically significant macular edema; DME diabetic macular edema; dbh dot blot hemorrhages; CWS cotton wool spot; POAG primary open angle glaucoma; C/D cup-to-disc ratio; HVF humphrey visual field; GVF goldmann visual field; OCT optical coherence tomography; IOP intraocular pressure; BRVO Branch retinal vein occlusion; CRVO central retinal vein occlusion; CRAO central retinal artery occlusion; BRAO branch retinal artery occlusion; RT retinal tear; SB scleral buckle; PPV pars plana vitrectomy; VH Vitreous hemorrhage; PRP panretinal laser photocoagulation; IVK intravitreal kenalog; VMT vitreomacular traction; MH Macular hole;  NVD  neovascularization of the disc; NVE neovascularization elsewhere; AREDS age related eye disease study; ARMD age related macular degeneration; POAG primary open angle glaucoma; EBMD epithelial/anterior basement membrane dystrophy; ACIOL anterior chamber intraocular lens; IOL intraocular lens; PCIOL posterior chamber intraocular lens; Phaco/IOL phacoemulsification with intraocular lens placement; PRK photorefractive keratectomy; LASIK laser assisted in situ keratomileusis; HTN hypertension; DM diabetes mellitus; COPD chronic obstructive pulmonary disease

## 2021-12-27 ENCOUNTER — Encounter (INDEPENDENT_AMBULATORY_CARE_PROVIDER_SITE_OTHER): Payer: BC Managed Care – PPO | Admitting: Ophthalmology

## 2022-01-10 ENCOUNTER — Other Ambulatory Visit: Payer: Self-pay | Admitting: Family Medicine

## 2022-01-16 NOTE — Progress Notes (Signed)
[facility] - Clinic Note  01/22/2022     CHIEF COMPLAINT Patient presents for Retina Follow Up   HISTORY OF PRESENT ILLNESS: Kristen Carrillo is a 36 y.o. female who presents to the clinic today for:   HPI     Retina Follow Up   Patient presents with  Other.  In both eyes.  Duration of 1 year.  Since onset it is stable.  I, the attending physician,  performed the HPI with the patient and updated documentation appropriately.        Comments   1 year follow up Lattice Degen OU, DM without retinopathy-  Doing well, last visit with Dr. Dione Booze only a slight change in Rx.  She did not update her glasses. BS 183 in office A1C 7.4      Last edited by Rennis Chris, MD on 01/22/2022  1:01 PM.    Pt states vision is stable, blood sugars are doing well  Referring physician: Sheliah Hatch, MD [address] [place],  [place] [address]  HISTORICAL INFORMATION:   Selected notes from the MEDICAL RECORD NUMBER Referred by Dr. Fabian Sharp for concern of lattice degeneration OU and atrophic holes OD LEE: 09.03.19 (S. Groat) [BCVA: OD: 20/25 OS:20/25] Ocular Hx-high myopia PMH-anxiety, depression, OCD, DM1 (takes novolog)    CURRENT MEDICATIONS: No current outpatient medications on file. (Ophthalmic Drugs)   No current facility-administered medications for this visit. (Ophthalmic Drugs)   Current Outpatient Medications (Other)  Medication Sig   amphetamine-dextroamphetamine (ADDERALL) 20 MG tablet 2 tablets in the morning and 1 in the evening.   atorvastatin (LIPITOR) 20 MG tablet TAKE 1 TABLET BY MOUTH EVERY DAY   Cholecalciferol (VITAMIN D3) 5000 units CAPS Take 1 capsule by mouth daily.   doxycycline (VIBRA-TABS) 100 MG tablet Take 1 tablet (100 mg total) by mouth 2 (two) times daily.   famotidine (PEPCID) 40 MG tablet TAKE 1 TABLET BY MOUTH EVERY DAY   FIASP 100 UNIT/ML SOLN INJECT 80 UNITS INTO THE SKIN DAILY. USE IN INSULIN PUMP   Fluvoxamine  Maleate 150 MG CP24 Take 2 capsules by mouth at bedtime.    Glucagon 3 MG/DOSE POWD Place 3 mg into the nose once as needed for up to 1 dose.   LORazepam (ATIVAN) 1 MG tablet    losartan (COZAAR) 25 MG tablet TAKE 1 TABLET (25 MG TOTAL) BY MOUTH DAILY.   NUVARING 0.12-0.015 MG/24HR vaginal ring INSERT 1 RING VAGINALLY AS DIRECTED. REMOVE AFTER 3 WEEKS & WAIT 7 DAYS BEFORE INSERTING A NEW RING   Omega-3 350 MG CPDR Take 1 capsule by mouth daily.   Urine Glucose-Ketones Test STRP Use 1-3 times daily as needed   vitamin B-12 (CYANOCOBALAMIN) 500 MCG tablet Take 500 mcg by mouth daily.   WELLBUTRIN XL 300 MG 24 hr tablet    glucose blood (CONTOUR NEXT TEST) test strip Use as instructed to test 3 times daily   insulin aspart (FIASP FLEXTOUCH) 100 UNIT/ML FlexTouch Pen Inject 45-55 Units into the skin 3 (three) times daily before meals. (Patient not taking: Reported on 01/17/2022)   Insulin Glargine (BASAGLAR KWIKPEN) 100 UNIT/ML Inject 35-40 Units into the skin at bedtime. (Patient not taking: Reported on 01/17/2022)   Insulin Pen Needle 32G X 4 MM MISC Use 4x a day   predniSONE (DELTASONE) 20 MG tablet Take 2 tablets (40 mg total) by mouth daily with breakfast. (Patient not taking: Reported on 01/22/2022)   Semaglutide,0.25 or  0.5MG /DOS, (OZEMPIC, 0.25 OR 0.5 MG/DOSE,) 2 MG/1.5ML SOPN Inject 0.5 mg into the skin once a week. (Patient not taking: Reported on 11/14/2021)   No current facility-administered medications for this visit. (Other)   REVIEW OF SYSTEMS: ROS   Positive for: Endocrine, Eyes, Psychiatric Negative for: Constitutional, Gastrointestinal, Neurological, Skin, Genitourinary, Musculoskeletal, HENT, Cardiovascular, Respiratory, Allergic/Imm, Heme/Lymph Last edited by Joni Reining, COA on 01/22/2022 12:39 PM.     ALLERGIES Allergies  Allergen Reactions   Adhesive [Tape] Rash   Colophony [Pinus Strobus] Hives and Rash   Formaldehyde Hives and Rash    Like a burn   Latex Rash   Nickel  Hives and Rash   Sulfa Antibiotics Rash   PAST MEDICAL HISTORY Past Medical History:  Diagnosis Date   Anxiety    Depression    Diabetes mellitus without complication [facility])    DMT1 age 3 Dr. Ernest Haber [facility])   Pneumonia    2005   Past Surgical History:  Procedure Laterality Date   CERVICAL POLYPECTOMY N/A 04/25/2016   Procedure: CERVICAL POLYPECTOMY;  Surgeon: Carrington Clamp, MD;  Location: [facility];  Service: Gynecology;  Laterality: N/A;   HYSTEROSCOPY WITH D & C N/A 04/25/2016   Procedure: DILATATION AND CURETTAGE /HYSTEROSCOPY;  Surgeon: Carrington Clamp, MD;  Location: [facility];  Service: Gynecology;  Laterality: N/A;   MULTIPLE TOOTH EXTRACTIONS     RETINAL LASER PROCEDURE     TYMPANOSTOMY TUBE PLACEMENT  age 46   FAMILY HISTORY Family History  Problem Relation Age of Onset   Diabetes Mother    Hypertension Mother    Hyperlipidemia Mother    Thyroid disease Mother    Heart disease Mother    Diabetes Maternal Uncle    Hypertension Maternal Uncle    Hypertension Maternal Grandmother    Heart disease Maternal Grandmother    Diabetes Maternal Grandfather    Hypertension Maternal Grandfather    Hyperlipidemia Maternal Grandfather    SOCIAL HISTORY Social History   Tobacco Use   Smoking status: Former    Types: Cigarettes    Quit date: 07/14/2009    Years since quitting: 12.5   Smokeless tobacco: Never  Vaping Use   Vaping Use: Never used  Substance Use Topics   Alcohol use: Yes    Alcohol/week: 0.0 standard drinks of alcohol    Comment: occasionally   Drug use: No       OPHTHALMIC EXAM:  Base Eye Exam     Visual Acuity (Snellen - Linear)       Right Left   Dist cc 20/20- 20/25-   Dist ph cc  20/20-    Correction: Glasses         Tonometry (Tonopen, 12:44 PM)       Right Left   Pressure 15 16         Pupils       Dark Light Shape React APD   Right 5 4 Round Brisk None   Left 5 4 Round Brisk None         Visual Fields  (Counting fingers)       Left Right    Full Full         Extraocular Movement       Right Left    Full Full         Neuro/Psych     Oriented x3: Yes   Mood/Affect: Normal         Dilation     Both eyes:  1.0% Mydriacyl, 2.5% Phenylephrine @ 12:45 PM           Slit Lamp and Fundus Exam     Slit Lamp Exam       Right Left   Lids/Lashes Dermatochalasis - upper lid Dermatochalasis - upper lid   Conjunctiva/Sclera White and quiet White and quiet   Cornea trace tear film debris trace tear film debris   Anterior Chamber Deep and quiet Deep and quiet   Iris Round and dilated, No NVI Round and dilated, No NVI   Lens Clear Punctate cortical changes   Anterior Vitreous mild syneresis, Posterior vitreous detachment, vitreous condensations Vitreous syneresis, Posterior vitreous detachment, vitreous condensations         Fundus Exam       Right Left   Disc Pink and Sharp, trace Peripapillary atrophy Pink and Sharp, Compact   C/D Ratio 0.2 0.2   Macula Flat, Good foveal reflex, Retinal pigment epithelial mottling, No heme or edema Flat, Good foveal reflex, Retinal pigment epithelial mottling, No heme or edema   Vessels mild attenuation, mild tortuosity, mild copper wiring attenuated, Tortuous, mild Copper wiring   Periphery Attached, VR tuft at 0530 equator, superior lattice degeneration at 1100-0130 ora, peripheral lattice SN quadrant, paving stone  at 0400 -- good laser from 1100 to 0130 and 0700 equator, lattice with atrophic holes at 1000 -- great laser changes, No new RT/RD/lattice, No heme Attached, Lattice degeneration from 0500-0700, superior lattice from 1000 to 0200 ora - good laser surrounding all lesions, No new RT/RD/lattice, No heme           Refraction     Wearing Rx       Sphere Cylinder Axis   Right -10.25 +3.50 095   Left -9.00` +3.00 081           IMAGING AND PROCEDURES  Imaging and Procedures for @TODAY @  OCT, Retina - OU - Both Eyes        Right Eye Quality was good. Central Foveal Thickness: 298. Progression has been stable. Findings include normal foveal contour, no IRF, no SRF, vitreomacular adhesion .   Left Eye Quality was good. Central Foveal Thickness: 304. Progression has been stable. Findings include normal foveal contour, no IRF, no SRF.   Notes *Images captured and stored on drive  Diagnosis / Impression:  NFP, No IRF/SRF OU  Clinical management:  See below  Abbreviations: NFP - Normal foveal profile. CME - cystoid macular edema. PED - pigment epithelial detachment. IRF - intraretinal fluid. SRF - subretinal fluid. EZ - ellipsoid zone. ERM - epiretinal membrane. ORA - outer retinal atrophy. ORT - outer retinal tubulation. SRHM - subretinal hyper-reflective material             ASSESSMENT/PLAN:    ICD-10-CM   1. Type 1 diabetes mellitus without retinopathy (HCC)  E10.9     2. 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 Retinal holes, bilateral  H33.323     4. High myopia, bilateral  H52.13      1. Diabetes mellitus, type 1 without retinopathy  - last A1c was 7.4 on 05.04.23, 7.6 on 03.29.22  - The incidence, risk factors for progression, natural history and treatment options for diabetic retinopathy were discussed with patient.    - The need for close monitoring of blood glucose, blood pressure, and serum lipids, avoiding cigarette or any type of tobacco, and the need for long term follow  up was also discussed with patient.  - f/u in 1 year, sooner prn  2,3. Lattice degeneration with atrophic retinal holes, OU -   - QM:[id number] lattice at ora, peripheral lattice SN quadrant, VR tuft at 530 equator OD  - new lattice with atrophic holes at 1000 OD noted 01.15.21  - OS: superior lattice from 1000 to 0200, lattice from 0500 to 0700 OS  - S/P laser retinopexy OD (10.04.19, 01.15.21) -- good laser surrounding lattice  - S/P laser retinopexy OS (11.01.19)  -- good laser in place  - f/u 1 year  4. High Myopia OU-   - discussed association of myopia with lattice degen and RT/RD  - doing well w/ current specs  - under the expert care of Dr. Dione Booze  Ophthalmic Meds Ordered this visit:  No orders of the defined types were placed in this encounter.    Return in about 1 year (around 01/23/2023) for f/u DM exam, DFE, OCT.  There are no Patient Instructions on file for this visit.  Explained the diagnoses, plan, and follow up with the patient and they expressed understanding.  Patient expressed understanding of the importance of proper follow up care.   This document serves as a record of services personally performed by Karie Chimera, MD, PhD. It was created on their behalf by De Blanch, an ophthalmic technician. The creation of this record is the provider's dictation and/or activities during the visit.    Electronically signed by: De Blanch, OA, 01/24/22  2:02 PM  This document serves as a record of services personally performed by Karie Chimera, MD, PhD. It was created on their behalf by Glee Arvin. Manson Passey, OA an ophthalmic technician. The creation of this record is the provider's dictation and/or activities during the visit.    Electronically signed by: Glee Arvin. Manson Passey, [place] 07.12.2023 2:02 PM  Karie Chimera, M.D., Ph.D. Diseases & Surgery of the Retina and Vitreous [facility]  I have reviewed the above documentation for accuracy and completeness, and I agree with the above. Karie Chimera, M.D., Ph.D. 01/24/22 2:04 PM   Abbreviations: M myopia (nearsighted); A astigmatism; H hyperopia (farsighted); P presbyopia; Mrx spectacle prescription;  CTL contact lenses; OD right eye; OS left eye; OU both eyes  XT exotropia; ET esotropia; PEK punctate epithelial keratitis; PEE punctate epithelial erosions; DES dry eye syndrome; MGD meibomian gland dysfunction; ATs artificial tears; PFAT's preservative free  artificial tears; NSC nuclear sclerotic cataract; PSC posterior subcapsular cataract; ERM epi-retinal membrane; PVD posterior vitreous detachment; RD retinal detachment; DM diabetes mellitus; DR diabetic retinopathy; NPDR non-proliferative diabetic retinopathy; PDR proliferative diabetic retinopathy; CSME clinically significant macular edema; DME diabetic macular edema; dbh dot blot hemorrhages; CWS cotton wool spot; POAG primary open angle glaucoma; C/D cup-to-disc ratio; HVF humphrey visual field; GVF goldmann visual field; OCT optical coherence tomography; IOP intraocular pressure; BRVO Branch retinal vein occlusion; CRVO central retinal vein occlusion; CRAO central retinal artery occlusion; BRAO branch retinal artery occlusion; RT retinal tear; SB scleral buckle; PPV pars plana vitrectomy; VH Vitreous hemorrhage; PRP panretinal laser photocoagulation; IVK intravitreal kenalog; VMT vitreomacular traction; MH Macular hole;  NVD neovascularization of the disc; NVE neovascularization elsewhere; AREDS age related eye disease study; ARMD age related macular degeneration; POAG primary open angle glaucoma; EBMD epithelial/anterior basement membrane dystrophy; ACIOL anterior chamber intraocular lens; IOL intraocular lens; PCIOL posterior chamber intraocular lens; Phaco/IOL phacoemulsification with intraocular lens placement; PRK photorefractive keratectomy; LASIK laser assisted in situ keratomileusis; HTN  hypertension; DM diabetes mellitus; COPD chronic obstructive pulmonary disease

## 2022-01-17 ENCOUNTER — Encounter: Payer: Self-pay | Admitting: Nurse Practitioner

## 2022-01-17 ENCOUNTER — Telehealth (INDEPENDENT_AMBULATORY_CARE_PROVIDER_SITE_OTHER): Payer: BC Managed Care – PPO | Admitting: Nurse Practitioner

## 2022-01-17 ENCOUNTER — Encounter: Payer: Self-pay | Admitting: Family Medicine

## 2022-01-17 VITALS — Temp 99.7°F | Ht 68.0 in | Wt 225.0 lb

## 2022-01-17 DIAGNOSIS — U071 COVID-19: Secondary | ICD-10-CM | POA: Diagnosis not present

## 2022-01-17 DIAGNOSIS — L089 Local infection of the skin and subcutaneous tissue, unspecified: Secondary | ICD-10-CM

## 2022-01-17 MED ORDER — PREDNISONE 20 MG PO TABS: 40.0000 mg | ORAL_TABLET | Freq: Every day | ORAL | 0 refills | Status: DC

## 2022-01-17 MED ORDER — DOXYCYCLINE HYCLATE 100 MG PO TABS: 100.0000 mg | ORAL_TABLET | Freq: Two times a day (BID) | ORAL | 0 refills | Status: DC

## 2022-01-17 NOTE — Progress Notes (Signed)
Established Patient Office Visit  An audio/visual tele-health visit was completed today for this patient. I connected with  Kristen Carrillo on 01/17/22 utilizing audio/visual technology and verified that I am speaking with the correct person using two identifiers. The patient was located at their home, and I was located at the office of [facility] at [facility] during the encounter. I discussed the limitations of evaluation and management by telemedicine. The patient expressed understanding and agreed to proceed.     Subjective   Patient ID: Kristen Carrillo, female    DOB: [birth year]  Age: 36 y.o. MRN: [id number]  Chief Complaint  Patient presents with   Wound Infection    Patient arrives today for the above.  She reports that approximately 1.5 weeks ago she got a tattoo while she was traveling in [place].  Since then the tattoo has not healed well and is now swollen, red, slightly tender, and blistering.  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  She was told that some tattoo colored inks have nickel as a ingredient.  Additionally she was experiencing COVID symptoms on June 30, but tested negative the next day.  Approximately 2 days ago she tested again and was positive.  She reports symptoms have been mild, and her main concern is her tattoo.  She denies fever.    Review of systems: See HPI    Objective:     Temp 99.7 F (37.6 C) Comment: has not taken tylenol or ibuprofen  Ht 5\' 8"  (1.727 m)   Wt 225 lb (102.1 kg)   BMI 34.21 kg/m    Physical Exam Comprehensive physical exam not completed today as office visit was conducted remotely.  Patient did show me her tattoo which is located on her anterior thigh, around the edges of the tattoo there is redness and swelling as well as blistering to the top of that tattoo.  She does report some drainage as well.  Patient was alert and  oriented, and appeared to have appropriate judgment.   No results found for any visits on 01/17/22.    The ASCVD Risk score (Arnett DK, et al., 2019) failed to calculate for the following reasons:   The 2019 ASCVD risk score is only valid for ages 64 to 44    Assessment & Plan:  1.  Skin infection: Recommend she continue washing 2-3 times a day with soap and water and leave it open to air is much as possible.  Encouraged her to use nonadherent gauze if she needs to go in public where she may be exposed to other bacteria.  We will treat with doxycycline 100 mg tablet twice a day for 14 days, as well as treat with prednisone 40 mg daily x5 days in case of allergic reaction.  She was told if symptoms worsen (defined as fever, worsening swelling, worsening redness, red streak marks, worsening/increased volume of drainage) that she needs to proceed to the emergency department to consider IV antibiotics.  We also discussed risk for elevated blood sugar and possible DKA while on prednisone, she is on a continuous glucose monitor and pump.  She was encouraged to make sure she monitors her blood sugar closely especially if she has any signs or symptoms of elevated blood sugar. Patient was educated on side effects of prednisone including insomnia, increased risk of GI bleed, increased appetite, and irritability.  She was  told to take medication in the morning with food and to avoid NSAID use while taking the medication.  The patient reports their understanding.   2. Covid positive: Symptoms appear mild, she is outside the window for antiviral therapy.  She was encouraged to proceed to the emergency department if she experiences significant fever, chest pain, or shortness of breath.  She reports her understanding.  Problem List Items Addressed This Visit   None Visit Diagnoses     Skin infection    -  Primary   Relevant Medications   doxycycline (VIBRA-TABS) 100 MG tablet   predniSONE (DELTASONE) 20 MG  tablet   COVID-19           No follow-ups on file.    Elenore Paddy, NP

## 2022-01-22 ENCOUNTER — Ambulatory Visit (INDEPENDENT_AMBULATORY_CARE_PROVIDER_SITE_OTHER): Payer: BC Managed Care – PPO | Admitting: Ophthalmology

## 2022-01-22 ENCOUNTER — Encounter (INDEPENDENT_AMBULATORY_CARE_PROVIDER_SITE_OTHER): Payer: Self-pay | Admitting: Ophthalmology

## 2022-01-22 DIAGNOSIS — H35413 Lattice degeneration of retina, bilateral: Secondary | ICD-10-CM

## 2022-01-22 DIAGNOSIS — H33323 Round hole, bilateral: Secondary | ICD-10-CM | POA: Diagnosis not present

## 2022-01-22 DIAGNOSIS — E109 Type 1 diabetes mellitus without complications: Secondary | ICD-10-CM

## 2022-01-22 DIAGNOSIS — H5213 Myopia, bilateral: Secondary | ICD-10-CM

## 2022-01-23 ENCOUNTER — Ambulatory Visit (INDEPENDENT_AMBULATORY_CARE_PROVIDER_SITE_OTHER): Payer: BC Managed Care – PPO | Admitting: Family Medicine

## 2022-01-23 VITALS — BP 128/76 | HR 106 | Temp 98.8°F | Resp 16 | Ht 68.0 in | Wt 232.0 lb

## 2022-01-23 DIAGNOSIS — L03115 Cellulitis of right lower limb: Secondary | ICD-10-CM | POA: Diagnosis not present

## 2022-01-23 DIAGNOSIS — L818 Other specified disorders of pigmentation: Secondary | ICD-10-CM

## 2022-01-23 DIAGNOSIS — U071 COVID-19: Secondary | ICD-10-CM | POA: Diagnosis not present

## 2022-01-23 NOTE — Progress Notes (Signed)
Subjective:  Patient ID: Kristen Carrillo, female    DOB: 1986-06-26  Age: 36 y.o. MRN: [id number]  CC:  Chief Complaint  Patient presents with   Tattoo Infection    Pt notes had tattoo in [place] on vacation 01/05/22, notes that she was stuck in the airport for some time and had sweat not keeping the tattoo dry. Notes has had a virtual was given abx and prednisone washing 2x a day notes not worse but not better either located still very red and irritated looking    HPI JUDETH GILLES presents for  Tattoo infection: Recent trip to [place].  Tattoo placed 01/05/2022.  Tattoo did get wet as above from sweating.  Not healing well. Virtual visit 6 days ago.  Reported swollen, red, tender and blistering. Concern for allergic reaction versus infection.  History of nickel allergy.  Prescribed prednisone 40 mg daily x5 days (finished yesterday) doxycycline 100 mg twice daily for 14 days, cleansing with soap and water, with covering if outside of home.  Close monitoring of glucose given history of diabetes also discussed.  Sugars ok on prednisone. Cleansing BID with soap and water. No missed doses with abx.  Not worse, some placed better, some minimal change. No discharge or bleeding.  No fever, feels well otherwise.    Separately did note that she had tested positive for COVID approximately July 5, symptoms started approximately June 30.  Mild symptoms. Min residual brain fog, but out for summer as Runner, broadcasting/film/video (middle school). No other residual sx's.    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, left 10/19/2020   Daytime somnolence 07/11/2020   Obstructive sleep apnea syndrome 07/11/2020   Nocturnal cough 02/16/2020   Meralgia paraesthetica, left 02/16/2020   Vitamin D deficiency 07/11/2019   Hyperlipidemia due to type 1 diabetes mellitus  (HCC) 07/11/2019   Physical exam 07/05/2018   OCD (obsessive compulsive disorder) 10/14/2015   Attention deficit hyperactivity disorder (ADHD) 10/14/2015   Type 1 diabetes mellitus with hypoglycemia and without coma (HCC) 09/19/2014   HEAT INTOLERANCE 12/15/2007   Past Medical History:  Diagnosis Date   Anxiety    Depression    Diabetes mellitus without complication [facility])    DMT1 age 57 Dr. Ernest Haber [facility])   Pneumonia    2005   Past Surgical History:  Procedure Laterality Date   CERVICAL POLYPECTOMY N/A 04/25/2016   Procedure: CERVICAL POLYPECTOMY;  Surgeon: Carrington Clamp, MD;  Location: [facility];  Service: Gynecology;  Laterality: N/A;   HYSTEROSCOPY WITH D & C N/A 04/25/2016   Procedure: DILATATION AND CURETTAGE /HYSTEROSCOPY;  Surgeon: Carrington Clamp, MD;  Location: [facility];  Service: Gynecology;  Laterality: N/A;   MULTIPLE TOOTH EXTRACTIONS     RETINAL LASER PROCEDURE     TYMPANOSTOMY TUBE PLACEMENT  age 53   Allergies  Allergen Reactions   Adhesive [Tape] Rash   Colophony [Pinus Strobus] Hives and Rash   Formaldehyde Hives and Rash    Like a burn   Latex Rash   Nickel Hives and Rash   Sulfa Antibiotics Rash   Prior to Admission medications   Medication Sig Start Date End Date Taking? Authorizing Provider  amphetamine-dextroamphetamine (ADDERALL) 20 MG tablet 2 tablets in the morning and 1 in the evening. 07/15/14   [provider]  atorvastatin (LIPITOR) 20 MG tablet TAKE  1 TABLET BY MOUTH EVERY DAY 04/30/21   Sheliah Hatch, MD  Cholecalciferol (VITAMIN D3) 5000 units CAPS Take 1 capsule by mouth daily.    [provider]  doxycycline (VIBRA-TABS) 100 MG tablet Take 1 tablet (100 mg total) by mouth 2 (two) times daily. 01/17/22   Elenore Paddy, NP  famotidine (PEPCID) 40 MG tablet TAKE 1 TABLET BY MOUTH EVERY DAY 01/10/22   Sheliah Hatch, MD  FIASP 100 UNIT/ML SOLN INJECT 80 UNITS INTO THE SKIN DAILY. USE IN INSULIN PUMP 11/27/21    Carlus Pavlov, MD  Fluvoxamine Maleate 150 MG CP24 Take 2 capsules by mouth at bedtime.  07/13/14   [provider]  Glucagon 3 MG/DOSE POWD Place 3 mg into the nose once as needed for up to 1 dose. 11/14/21   Carlus Pavlov, MD  glucose blood (CONTOUR NEXT TEST) test strip Use as instructed to test 3 times daily 08/26/17   Carlus Pavlov, MD  insulin aspart (FIASP FLEXTOUCH) 100 UNIT/ML FlexTouch Pen Inject 45-55 Units into the skin 3 (three) times daily before meals. Patient not taking: Reported on 01/17/2022 12/20/21   Carlus Pavlov, MD  Insulin Glargine [facility]) 100 UNIT/ML Inject 35-40 Units into the skin at bedtime. Patient not taking: Reported on 01/17/2022 12/20/21   Carlus Pavlov, MD  Insulin Pen Needle 32G X 4 MM MISC Use 4x a day 12/20/21   Carlus Pavlov, MD  LORazepam (ATIVAN) 1 MG tablet  07/04/19   [provider]  losartan (COZAAR) 25 MG tablet TAKE 1 TABLET (25 MG TOTAL) BY MOUTH DAILY. 01/10/22   Sheliah Hatch, MD  NUVARING 0.12-0.015 MG/24HR vaginal ring INSERT 1 RING VAGINALLY AS DIRECTED. REMOVE AFTER 3 WEEKS & WAIT 7 DAYS BEFORE INSERTING A NEW RING 07/31/17   [provider]  Omega-3 350 MG CPDR Take 1 capsule by mouth daily.    [provider]  predniSONE (DELTASONE) 20 MG tablet Take 2 tablets (40 mg total) by mouth daily with breakfast. Patient not taking: Reported on 01/22/2022 01/17/22   Elenore Paddy, NP  Semaglutide,0.25 or 0.5MG /DOS, (OZEMPIC, 0.25 OR 0.5 MG/DOSE,) 2 MG/1.5ML SOPN Inject 0.5 mg into the skin once a week. Patient not taking: Reported on 11/14/2021 07/02/21   Carlus Pavlov, MD  Urine Glucose-Ketones Test STRP Use 1-3 times daily as needed 06/04/18   Carlus Pavlov, MD  vitamin B-12 (CYANOCOBALAMIN) 500 MCG tablet Take 500 mcg by mouth daily.    [provider]  [facility] XL 300 MG 24 hr tablet  07/04/19   [provider]   Social History   Socioeconomic History    Marital status: Single    Spouse name: Not on file   Number of children: Not on file   Years of education: Not on file   Highest education level: Not on file  Occupational History   Not on file  Tobacco Use   Smoking status: Former    Types: Cigarettes    Quit date: 07/14/2009    Years since quitting: 12.5   Smokeless tobacco: Never  Vaping Use   Vaping Use: Never used  Substance and Sexual Activity   Alcohol use: Yes    Alcohol/week: 0.0 standard drinks of alcohol    Comment: occasionally   Drug use: No   Sexual activity: Not on file  Other Topics Concern   Not on file  Social History Narrative   Single   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   Objective:   Vitals:   01/23/22 1200  BP: 128/76  Pulse: (!) 106  Resp: 16  Temp: 98.8 F (37.1 C)  TempSrc: Oral  SpO2: 99%  Weight: 232 lb (105.2 kg)  Height: 5\' 8"  (1.727 m)     Physical Exam Constitutional:      General: She is not in acute distress.    Appearance: Normal appearance. She is well-developed.  HENT:     Head: Normocephalic and atraumatic.  Cardiovascular:     Rate and Rhythm: Normal rate.  Pulmonary:     Effort: Pulmonary effort is normal.  Skin:    Comments: Right lateral thigh, see photos.  Slight induration of the distal aspect of tattoo with overlying thin eschar.  No discharge.  Neurological:     Mental Status: She is alert and oriented to person, place, and time.  Psychiatric:        Mood and Affect: Mood normal.     Initial photo 7/1 on pt's phone, 2nd one today.      Assessment & Plan:  VINITA POEHLER is a 36 y.o. female . History of tattoo - Plan: Ambulatory referral to  Dermatology  Cellulitis of right lower extremity - Plan: Ambulatory referral to Dermatology  COVID-19 virus infection Cellulitis versus inflammation/reaction to dye with tattoo.  Reports overall stable symptoms with some areas appearing to be improved.  Surrounding erythema appears to be stable or improving.  Appearance on distal aspect of tattoo may be early eschar.  I did not see any specific ulceration.  Options discussed.  Ultimately decided to continue close monitoring outpatient, continue doxycycline, no additional antibiotics started at this time.  Will refer to dermatology, but if any worsening appearance or new symptoms advised to be seen through the ER or urgent care.  Understanding expressed.  She will update me in the next few days on status.  COVID infection improved, mild symptoms.  No orders of the defined types were placed in this encounter.  Patient Instructions  Continue antibiotic, keep area clean and covered.  If any increasing redness, discharge or worsening be seen through urgent care or ER as we discussed.  I will place a referral to dermatology.  Keep me posted on how things are going through the weekend.  Glad to hear you have improved from COVID infection.  Take care.    Signed,   Merri Ray, MD [facility] 01/24/22 3:20 PM

## 2022-01-23 NOTE — Patient Instructions (Signed)
Continue antibiotic, keep area clean and covered.  If any increasing redness, discharge or worsening be seen through urgent care or ER as we discussed.  I will place a referral to dermatology.  Keep me posted on how things are going through the weekend.  Glad to hear you have improved from COVID infection.  Take care.

## 2022-01-24 ENCOUNTER — Encounter (INDEPENDENT_AMBULATORY_CARE_PROVIDER_SITE_OTHER): Payer: Self-pay | Admitting: Ophthalmology

## 2022-01-24 ENCOUNTER — Encounter: Payer: Self-pay | Admitting: Family Medicine

## 2022-01-27 ENCOUNTER — Encounter: Payer: Self-pay | Admitting: Internal Medicine

## 2022-01-29 NOTE — Telephone Encounter (Signed)
Opened in error

## 2022-01-30 ENCOUNTER — Telehealth: Payer: Self-pay | Admitting: Family Medicine

## 2022-01-30 ENCOUNTER — Encounter: Payer: Self-pay | Admitting: Family Medicine

## 2022-01-30 NOTE — Telephone Encounter (Signed)
Caller name: Kristen Carrillo (pt)  On DPR? :yes/no: Yes  Call back number: [phone]  Provider they see: Dr. Beverely Low  Reason for call: Pt calling b/c was referred to a dermatologist, but they are not accepting new pt's until October. Is there another provider that she can be referred to? Please advise.

## 2022-01-31 NOTE — Telephone Encounter (Signed)
Will send this message to our referral coordinator (who is back in the office on Monday).  Since it is a tattoo infection, we will try and get her seen sooner than October but many of the dermatology offices are booking out months.

## 2022-02-05 NOTE — Telephone Encounter (Signed)
Referral has been sent to a new location. Pt is aware

## 2022-02-09 ENCOUNTER — Encounter: Payer: Self-pay | Admitting: Internal Medicine

## 2022-02-10 ENCOUNTER — Other Ambulatory Visit: Payer: Self-pay | Admitting: Internal Medicine

## 2022-02-10 MED ORDER — FIASP 100 UNIT/ML IJ SOLN
INTRAMUSCULAR | 3 refills | Status: DC
Start: 2022-02-10 — End: 2022-09-29

## 2022-02-10 NOTE — Telephone Encounter (Signed)
Spoke with Bonita Quin in regards to patient receiving a sample of an Infusion Set. Says that it is okay to provide the patient with one. Will verify with the patient as to which infusion set she is using.

## 2022-03-28 ENCOUNTER — Ambulatory Visit: Payer: BC Managed Care – PPO | Admitting: Internal Medicine

## 2022-04-22 ENCOUNTER — Encounter: Payer: Self-pay | Admitting: Internal Medicine

## 2022-04-23 ENCOUNTER — Telehealth: Payer: Self-pay

## 2022-04-23 NOTE — Telephone Encounter (Signed)
Inbound message from pt requesting form be completed and faxed with clinical notes. DME supplies ordered via [employer] through online portal.  

## 2022-05-10 ENCOUNTER — Other Ambulatory Visit: Payer: Self-pay | Admitting: Family Medicine

## 2022-05-16 ENCOUNTER — Encounter: Payer: Self-pay | Admitting: Internal Medicine

## 2022-05-16 ENCOUNTER — Ambulatory Visit (INDEPENDENT_AMBULATORY_CARE_PROVIDER_SITE_OTHER): Payer: BC Managed Care – PPO | Admitting: Internal Medicine

## 2022-05-16 VITALS — BP 128/80 | HR 117 | Ht 68.0 in | Wt 233.6 lb

## 2022-05-16 DIAGNOSIS — E669 Obesity, unspecified: Secondary | ICD-10-CM

## 2022-05-16 DIAGNOSIS — E1069 Type 1 diabetes mellitus with other specified complication: Secondary | ICD-10-CM

## 2022-05-16 DIAGNOSIS — E10649 Type 1 diabetes mellitus with hypoglycemia without coma: Secondary | ICD-10-CM

## 2022-05-16 DIAGNOSIS — E785 Hyperlipidemia, unspecified: Secondary | ICD-10-CM | POA: Diagnosis not present

## 2022-05-16 LAB — POCT GLYCOSYLATED HEMOGLOBIN (HGB A1C): Hemoglobin A1C: 7.8 % — AB (ref 4.0–5.6)
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
## 2022-05-16 NOTE — Patient Instructions (Addendum)
Please send me a message if we need to change to [place] 3 CGM.  Please use the following settings: - basal rates: 12 am: 1.9 5 am:  2.0 8 am: 1.8   11 am: 1.95 2 pm: 1.8 4 pm: 1.95 10 pm: 1.8 - ICR:  12 am: 5.0 4 am: 4.5 11 am: 5.0 >> 4.5 5 pm: 4.5 - target: 110-110 - ISF: 40, except 50 between 12 am-4 pm - Insulin on Board: 5h >> 3h    Please stop at the lab.  Please return in 4 months.

## 2022-05-17 LAB — COMPREHENSIVE METABOLIC PANEL
ALT: 13 IU/L (ref 0–32)
AST: 16 IU/L (ref 0–40)
Albumin/Globulin Ratio: 1.3 (ref 1.2–2.2)
Albumin: 3.9 g/dL (ref 3.9–4.9)
Alkaline Phosphatase: 92 IU/L (ref 44–121)
BUN/Creatinine Ratio: 17 (ref 9–23)
BUN: 16 mg/dL (ref 6–20)
Bilirubin Total: <0.2 mg/dL (ref 0.0–1.2)
CO2: 23 mmol/L (ref 20–29)
Calcium: 9.5 mg/dL (ref 8.7–10.2)
Chloride: 102 mmol/L (ref 96–106)
Creatinine, Ser: 0.95 mg/dL (ref 0.57–1.00)
Globulin, Total: 3 g/dL (ref 1.5–4.5)
Glucose: 64 mg/dL — ABNORMAL LOW (ref 70–99)
Potassium: 4.1 mmol/L (ref 3.5–5.2)
Sodium: 140 mmol/L (ref 134–144)
Total Protein: 6.9 g/dL (ref 6.0–8.5)
eGFR: 80 mL/min/{1.73_m2} (ref >59)

## 2022-05-17 LAB — LIPID PANEL
Chol/HDL Ratio: 2.2 ratio (ref 0.0–4.4)
Cholesterol, Total: 184 mg/dL (ref 100–199)
HDL: 85 mg/dL (ref >39)
LDL Chol Calc (NIH): 84 mg/dL (ref 0–99)
Triglycerides: 85 mg/dL (ref 0–149)
VLDL Cholesterol Cal: 15 mg/dL (ref 5–40)

## 2022-05-17 LAB — MICROALBUMIN / CREATININE URINE RATIO
Creatinine, Urine: 322.5 mg/dL
Microalb/Creat Ratio: 3 mg/g creat (ref 0–29)
Microalbumin, Urine: 9 ug/mL

## 2022-05-17 LAB — TSH: TSH: 3.03 u[IU]/mL (ref 0.450–4.500)

## 2022-06-23 ENCOUNTER — Encounter: Payer: Self-pay | Admitting: Family Medicine

## 2022-06-23 ENCOUNTER — Ambulatory Visit: Payer: BC Managed Care – PPO | Admitting: Family Medicine

## 2022-06-23 VITALS — BP 116/82 | HR 94 | Temp 97.7°F | Resp 17 | Ht 68.0 in | Wt 230.4 lb

## 2022-06-23 DIAGNOSIS — R052 Subacute cough: Secondary | ICD-10-CM | POA: Diagnosis not present

## 2022-06-23 MED ORDER — GUAIFENESIN-CODEINE 100-10 MG/5ML PO SYRP: 10.0000 mL | ORAL_SOLUTION | Freq: Three times a day (TID) | ORAL | 0 refills | Status: DC | PRN

## 2022-06-23 MED ORDER — AZITHROMYCIN 250 MG PO TABS: ORAL_TABLET | ORAL | 0 refills | Status: DC

## 2022-06-23 NOTE — Patient Instructions (Signed)
Follow up as needed or as scheduled START the Zpack as directed- 2 today and then 1 daily x4 days USE the cough syrup as needed for nights/weekends Take Mucinex DM OR Delsym OR Robitussin to help w/ daytime cough Drink LOTS of fluids REST! Hang in there! Happy Holidays!!!

## 2022-06-23 NOTE — Progress Notes (Unsigned)
   Subjective:    Patient ID: Kristen Carrillo, female    DOB: 04/01/1986, 36 y.o.   MRN: [id number]  HPI Cough- pt reports cough since 'end of October'.  She thought it would go away but in the last week it has become 'wet sounding'.  Worse at night.  + hoarseness.  Some nasal congestion.  No fevers.  Some body aches and chills.  + sick contacts.  Cough is occasionally productive.   Review of Systems For ROS see HPI     Objective:   Physical Exam Vitals reviewed.  Constitutional:      General: She is not in acute distress.    Appearance: Normal appearance. She is well-developed. She is not ill-appearing.  HENT:     Head: Normocephalic and atraumatic.     Right Ear: Tympanic membrane and ear canal normal.     Left Ear: Tympanic membrane and ear canal normal.     Nose: Congestion present. No rhinorrhea.  Eyes:     Conjunctiva/sclera: Conjunctivae normal.     Pupils: Pupils are equal, round, and reactive to light.  Cardiovascular:     Rate and Rhythm: Normal rate and regular rhythm.     Heart sounds: Normal heart sounds. No murmur heard. Pulmonary:     Effort: Pulmonary effort is normal. No respiratory distress.     Breath sounds: Normal breath sounds. No wheezing.     Comments: + wet cough Musculoskeletal:     Cervical back: Normal range of motion and neck supple.  Lymphadenopathy:     Cervical: No cervical adenopathy.  Neurological:     Mental Status: She is alert.           Assessment & Plan:  Subacute cough- new.  Suspect that for the last 3 weeks she had a post viral or reactive cough.  But in the last week, her cough has changed/worsened.  As she is a Runner, broadcasting/film/video, she has had numerous sick contacts.  Given her underlying diabetes, she is at increased risk for bacterial infection.  Start Zpack and cough meds prn.  Reviewed supportive care and red flags that should prompt return.  Pt expressed understanding and is in agreement w/ plan.

## 2022-06-29 ENCOUNTER — Encounter: Payer: Self-pay | Admitting: Internal Medicine

## 2022-06-29 DIAGNOSIS — E10649 Type 1 diabetes mellitus with hypoglycemia without coma: Secondary | ICD-10-CM

## 2022-06-30 MED ORDER — BASAGLAR KWIKPEN 100 UNIT/ML ~~LOC~~ SOPN: 35.0000 [IU] | PEN_INJECTOR | Freq: Every day | SUBCUTANEOUS | 3 refills | Status: DC

## 2022-06-30 MED ORDER — "BD SAFETYGLIDE INSULIN SYRINGE 31G X 15/64"" 1 ML MISC": 3 refills | Status: AC

## 2022-07-10 ENCOUNTER — Telehealth: Payer: Self-pay

## 2022-07-10 NOTE — Telephone Encounter (Signed)
Pt notes was seen a few weeks ago, notes all other sxs have cleared other than her ear feels clogged or underwater, finished Zpak she was given but wondered if she needed more   Does she need an appointment?

## 2022-07-11 NOTE — Telephone Encounter (Signed)
I know you're busy, pt called back given 3 weeks should I have her follow up with Tabori?

## 2022-07-11 NOTE — Telephone Encounter (Signed)
Patient called back to get a update on this request.

## 2022-07-11 NOTE — Telephone Encounter (Signed)
Pt informed

## 2022-07-11 NOTE — Telephone Encounter (Signed)
I reviewed her last visit with Dr. Beverely Low.  Subacute cough, possible postviral or reactive cough.  Started on Z-Pak.  Based on that note the ear exam was clear, and I do not see any mention of ear congestion at that time.  If she is still having some feeling of fluid in the ears, can try Flonase over-the-counter or short-term Afrin for 1 to 2 days to see if some of that could be just some residual congestion.  Occasionally there can be  otitis media with effusion which is fluid behind the ear that is not infected.  Typically would not recommend repeat antibiotics unless we saw a definite infection.  If she is having pain, fevers or worsening symptoms should be seen right away.  Otherwise can try the Flonase, Afrin approach as above and then follow-up early next week if not improving.  Appreciate her patience on my reply.

## 2022-07-17 ENCOUNTER — Encounter: Payer: Self-pay | Admitting: Family Medicine

## 2022-07-17 ENCOUNTER — Ambulatory Visit: Payer: BC Managed Care – PPO | Admitting: Family Medicine

## 2022-07-17 VITALS — BP 130/78 | HR 111 | Temp 97.8°F | Resp 16 | Ht 68.0 in | Wt 230.0 lb

## 2022-07-17 DIAGNOSIS — H6992 Unspecified Eustachian tube disorder, left ear: Secondary | ICD-10-CM | POA: Diagnosis not present

## 2022-07-17 MED ORDER — PREDNISONE 10 MG PO TABS: ORAL_TABLET | ORAL | 0 refills | Status: DC

## 2022-07-17 NOTE — Progress Notes (Signed)
   Subjective:    Patient ID: Kristen Carrillo, female    DOB: 1985-12-06, 37 y.o.   MRN: [id number]  HPI L ear pain- pt reports sxs started 'over a week ago'.  Progressively worsened.  Unable to hear out of L ear.  No fevers.  No drainage from ear.  Denies sinus pain/pressure.  Taking allergy medication and using nasal spray.   Review of Systems For ROS see HPI     Objective:   Physical Exam Vitals reviewed.  Constitutional:      General: She is not in acute distress.    Appearance: Normal appearance. She is well-developed. She is not ill-appearing.  HENT:     Head: Normocephalic and atraumatic.     Right Ear: Tympanic membrane is not erythematous, retracted or bulging.     Left Ear: Tympanic membrane is retracted. Tympanic membrane is not erythematous.     Nose: Mucosal edema and congestion present. No rhinorrhea.     Right Sinus: No maxillary sinus tenderness or frontal sinus tenderness.     Left Sinus: No maxillary sinus tenderness or frontal sinus tenderness.     Mouth/Throat:     Pharynx: Posterior oropharyngeal erythema (w/ PND) present.  Eyes:     Conjunctiva/sclera: Conjunctivae normal.     Pupils: Pupils are equal, round, and reactive to light.  Cardiovascular:     Rate and Rhythm: Normal rate and regular rhythm.     Heart sounds: Normal heart sounds.  Pulmonary:     Effort: Pulmonary effort is normal. No respiratory distress.     Breath sounds: Normal breath sounds. No wheezing or rales.  Musculoskeletal:     Cervical back: Normal range of motion and neck supple.  Lymphadenopathy:     Cervical: No cervical adenopathy.  Skin:    General: Skin is warm and dry.  Neurological:     General: No focal deficit present.     Mental Status: She is alert and oriented to person, place, and time.  Psychiatric:        Mood and Affect: Mood normal.        Behavior: Behavior normal.        Thought Content: Thought content normal.           Assessment & Plan:  L  eustachian tube dysfxn- new.  No evidence of bacterial infxn on PE.  TM retraction and sxs are consistent w/ dx.  Start Prednisone but cautioned pt to be very mindful of her carb/sugar intake due to elevated sugars.  Pt expressed understanding and is in agreement w/ plan.

## 2022-07-17 NOTE — Patient Instructions (Addendum)
Follow up as needed or as scheduled START the prednisone as directed- take w/ food CONTINUE the daily allergy medication SWITCH to Fluticasone nasal spray (Flonase) Drink LOTS of fluids Adjust your insulin as needed because of the Prednisone Call with any questions or concerns Hang in there!!!

## 2022-07-18 ENCOUNTER — Encounter: Payer: BC Managed Care – PPO | Admitting: Family Medicine

## 2022-08-23 ENCOUNTER — Other Ambulatory Visit: Payer: Self-pay | Admitting: Family Medicine

## 2022-09-04 ENCOUNTER — Encounter: Payer: Self-pay | Admitting: Family Medicine

## 2022-09-08 ENCOUNTER — Ambulatory Visit: Payer: BC Managed Care – PPO | Admitting: Family Medicine

## 2022-09-08 ENCOUNTER — Encounter: Payer: Self-pay | Admitting: Family Medicine

## 2022-09-08 VITALS — BP 116/82 | HR 107 | Temp 98.2°F | Resp 16 | Ht 68.0 in | Wt 238.2 lb

## 2022-09-08 DIAGNOSIS — H6991 Unspecified Eustachian tube disorder, right ear: Secondary | ICD-10-CM

## 2022-09-08 MED ORDER — AZELASTINE HCL 0.1 % NA SOLN: 1.0000 | Freq: Two times a day (BID) | NASAL | 3 refills | Status: DC

## 2022-09-08 NOTE — Patient Instructions (Signed)
Follow up as needed or as scheduled Drink LOTS of fluids Continue the daily allergy medication ADD the antihistamine nasal spray (Astelin)- twice daily Call with any questions or concerns Hang in there!!!

## 2022-09-08 NOTE — Progress Notes (Signed)
   Subjective:    Patient ID: Kristen Carrillo, female    DOB: Apr 05, 1986, 37 y.o.   MRN: [id number]  HPI Decreased hearing- R ear.  Sxs started 4-5 days ago.  Woke up and wasn't able to hear.  Is taking allergy pill 'religiously'.  Does have some nasal congestion and PND.  No fever.  No ear pain.   Review of Systems For ROS see HPI     Objective:   Physical Exam Vitals reviewed.  Constitutional:      General: She is not in acute distress.    Appearance: Normal appearance. She is obese. She is not ill-appearing.  HENT:     Head: Normocephalic and atraumatic.     Right Ear: Ear canal normal. Tympanic membrane is erythematous and retracted. Tympanic membrane has decreased mobility.     Left Ear: Tympanic membrane and ear canal normal. Tympanic membrane is not erythematous or retracted. Tympanic membrane has normal mobility.     Nose: Congestion present. No rhinorrhea.     Comments: No TTP over frontal or maxillary sinuses Musculoskeletal:     Cervical back: Normal range of motion and neck supple.  Lymphadenopathy:     Cervical: No cervical adenopathy.  Neurological:     Mental Status: She is alert.           Assessment & Plan:   Eustachian tube dysfxn- pt has hx of similar on L side but sxs are currently on R.  No evidence of infxn.  TM very retracted.  Will add nasal antihistamine to improve sxs.  Will try and avoid oral steroids at this time given her DM.  Pt to let me know if sxs don't improve.

## 2022-09-09 ENCOUNTER — Encounter: Payer: Self-pay | Admitting: Family Medicine

## 2022-09-16 ENCOUNTER — Encounter: Payer: Self-pay | Admitting: Internal Medicine

## 2022-09-16 ENCOUNTER — Ambulatory Visit: Payer: BC Managed Care – PPO | Admitting: Internal Medicine

## 2022-09-16 VITALS — BP 130/88 | HR 122 | Ht 68.0 in | Wt 235.8 lb

## 2022-09-16 DIAGNOSIS — E10649 Type 1 diabetes mellitus with hypoglycemia without coma: Secondary | ICD-10-CM

## 2022-09-16 LAB — POCT GLYCOSYLATED HEMOGLOBIN (HGB A1C): Hemoglobin A1C: 8.2 % — AB (ref 4.0–5.6)

## 2022-09-16 NOTE — Patient Instructions (Addendum)
Please use the following settings: - basal rates: 12 am: 1.9 5 am:  2.0 8 am: 1.8   11 am: 1.95 2 pm: 1.8 4 pm: 1.95 10 pm: 1.8 - ICR:  12 am: 5.0 4 am: 4.5 11 am: 5.0 >> 4.5 5 pm: 4.5 >> 4 - target: 110-110 - ISF: 40, except 50 between 12 am-5 pm - Insulin on Board: 5h >> 3h   Let's switch to the [place] 2+ sensor.   Please return in 4 months.
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
## 2022-09-17 ENCOUNTER — Telehealth: Payer: Self-pay

## 2022-09-17 NOTE — Telephone Encounter (Signed)
Inbound call  requesting form be completed and faxed with clinical notes for Libre 2.5. DME supplies ordered via [employer] through online portal.

## 2022-09-22 ENCOUNTER — Other Ambulatory Visit: Payer: Self-pay | Admitting: Internal Medicine

## 2022-09-22 DIAGNOSIS — E10649 Type 1 diabetes mellitus with hypoglycemia without coma: Secondary | ICD-10-CM

## 2022-09-23 ENCOUNTER — Other Ambulatory Visit: Payer: Self-pay | Admitting: Internal Medicine

## 2022-09-23 MED ORDER — LANTUS SOLOSTAR 100 UNIT/ML ~~LOC~~ SOPN: 35.0000 [IU] | PEN_INJECTOR | Freq: Every day | SUBCUTANEOUS | 5 refills | Status: AC

## 2022-09-29 ENCOUNTER — Other Ambulatory Visit: Payer: Self-pay | Admitting: Internal Medicine

## 2022-11-24 ENCOUNTER — Telehealth: Payer: Self-pay

## 2022-11-24 ENCOUNTER — Other Ambulatory Visit: Payer: Self-pay | Admitting: Internal Medicine

## 2022-11-24 ENCOUNTER — Encounter: Payer: Self-pay | Admitting: Internal Medicine

## 2022-11-24 DIAGNOSIS — E10649 Type 1 diabetes mellitus with hypoglycemia without coma: Secondary | ICD-10-CM

## 2022-11-24 NOTE — Telephone Encounter (Signed)
Pt.notified

## 2022-11-24 NOTE — Telephone Encounter (Signed)
I placed the referral to see Bonita Quin

## 2022-11-24 NOTE — Telephone Encounter (Signed)
Pt needs referral to see diabetic educator for pump update.

## 2022-11-26 ENCOUNTER — Telehealth: Payer: Self-pay | Admitting: Nutrition

## 2022-11-26 NOTE — Telephone Encounter (Signed)
LVM that I have contacted Tandem rep. And tandem trainer to assist with this

## 2022-11-26 NOTE — Telephone Encounter (Signed)
LVM to call me to get some paperwork filled out before she can update her pump.

## 2022-12-05 ENCOUNTER — Other Ambulatory Visit: Payer: Self-pay | Admitting: Family Medicine

## 2022-12-16 ENCOUNTER — Telehealth: Payer: Self-pay | Admitting: Nutrition

## 2022-12-16 NOTE — Telephone Encounter (Signed)
LVM on her machine to call me to let me know if she needs assistance in transferring the data to her new pump, and or if she needs assistance in linking the data to tandem source so that we are able to view the data from her pump on line.  Telephone number given to call me to let me know if she needs help or that she has done this.

## 2022-12-30 LAB — HM PAP SMEAR: HM Pap smear: NORMAL

## 2023-01-02 ENCOUNTER — Encounter: Payer: Self-pay | Admitting: Family Medicine

## 2023-01-13 ENCOUNTER — Ambulatory Visit: Payer: BC Managed Care – PPO | Admitting: Internal Medicine

## 2023-01-13 ENCOUNTER — Encounter: Payer: Self-pay | Admitting: Internal Medicine

## 2023-01-13 VITALS — BP 112/64 | HR 108 | Ht 68.0 in | Wt 240.4 lb

## 2023-01-13 DIAGNOSIS — E785 Hyperlipidemia, unspecified: Secondary | ICD-10-CM | POA: Diagnosis not present

## 2023-01-13 DIAGNOSIS — E10649 Type 1 diabetes mellitus with hypoglycemia without coma: Secondary | ICD-10-CM

## 2023-01-13 DIAGNOSIS — E669 Obesity, unspecified: Secondary | ICD-10-CM | POA: Diagnosis not present

## 2023-01-13 DIAGNOSIS — E1069 Type 1 diabetes mellitus with other specified complication: Secondary | ICD-10-CM

## 2023-01-13 DIAGNOSIS — E119 Type 2 diabetes mellitus without complications: Secondary | ICD-10-CM

## 2023-01-13 LAB — HEMOGLOBIN A1C: Hemoglobin A1C: 7.4

## 2023-01-13 NOTE — Patient Instructions (Addendum)
Please use the following settings: - basal rates: 12 am: 1.9 >> 2.0 5 am:  2.0 8 am: 1.8   11 am: 1.95 2 pm: 1.8 >> 1.95 4 pm: 1.95 10 pm: 1.8 - ICR:  12 am: 5.0 4 am: 4.5 11 am: 4.5 (if sugars remain high, need to change this to 4.0) 5 pm: 4.0 - target: 110-110 - ISF: 40, except 50 between 12 am-5 pm - Insulin on Board: 3h  Try to start the auto mode.   Please return in 4 months.
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
## 2023-01-19 ENCOUNTER — Encounter: Payer: Self-pay | Admitting: Internal Medicine

## 2023-01-21 ENCOUNTER — Encounter (INDEPENDENT_AMBULATORY_CARE_PROVIDER_SITE_OTHER): Payer: BC Managed Care – PPO | Admitting: Ophthalmology

## 2023-01-23 NOTE — Progress Notes (Signed)
[facility] - Clinic Note  01/28/2023     CHIEF COMPLAINT Patient presents for Retina Follow Up   HISTORY OF PRESENT ILLNESS: Kristen Carrillo is a 37 y.o. female who presents to the clinic today for:   HPI     Retina Follow Up   Patient presents with  Other.  In both eyes.  Severity is moderate.  Duration of 1 year.  Since onset it is stable.  I, the attending physician,  performed the HPI with the patient and updated documentation appropriately.        Comments   Pt here for 1 year DM exam. Pt states VA is stable, scheduled for AEE in August w/ Dr. Dione Booze. Same rx specs from last year. Last A1C reported at 7.4.       Last edited by Rennis Chris, MD on 01/31/2023  9:23 PM.    Pt states she has an appt with Dr. Dione Booze in August, no concerns with her vision, last A1c was 7.4 in June  Referring physician: Olivia Canter, MD [address] [place],  [place] [address]  HISTORICAL INFORMATION:   Selected notes from the MEDICAL RECORD NUMBER Referred by Dr. Fabian Sharp for concern of lattice degeneration OU and atrophic holes OD LEE: 09.03.19 ([facility]. [facility]) [[facility]: OD: 20/25 OS:20/25] Ocular Hx-high myopia PMH-anxiety, depression, OCD, DM1 (takes novolog)    CURRENT MEDICATIONS: No current outpatient medications on file. (Ophthalmic Drugs)   No current facility-administered medications for this visit. (Ophthalmic Drugs)   Current Outpatient Medications (Other)  Medication Sig   amphetamine-dextroamphetamine (ADDERALL) 20 MG tablet 2 tablets in the morning and 1 in the evening.   atorvastatin (LIPITOR) 20 MG tablet TAKE 1 TABLET BY MOUTH EVERY DAY   Azelastine HCl 137 MCG/SPRAY SOLN PLACE 1 SPRAY INTO BOTH NOSTRILS 2 (TWO) TIMES DAILY. USE IN EACH NOSTRIL AS DIRECTED   Cholecalciferol (VITAMIN D3) 5000 units CAPS Take 1 capsule by mouth daily.   Dextroamphetamine Sulfate 20 MG TABS Take 20 mg by mouth 1 day or 1 dose.   famotidine (PEPCID) 40 MG  tablet TAKE 1 TABLET BY MOUTH EVERY DAY   FIASP 100 UNIT/ML SOLN USE UP TO 120 UNITS PER DAY IN THE INSULIN PUMP   Glucagon 3 MG/DOSE POWD Place 3 mg into the nose once as needed for up to 1 dose.   glucose blood (CONTOUR NEXT TEST) test strip Use as instructed to test 3 times daily   insulin aspart (FIASP FLEXTOUCH) 100 UNIT/ML FlexTouch Pen Inject 45-55 Units into the skin 3 (three) times daily before meals.   insulin glargine (LANTUS SOLOSTAR) 100 UNIT/ML Solostar Pen Inject 35-40 Units into the skin at bedtime.   Insulin Pen Needle 32G X 4 MM MISC Use 4x a day   Insulin Syringe-Needle U-100 (BD SAFETYGLIDE INSULIN SYRINGE) 31G X 15/64" 1 ML MISC Use as instructed to inject insulin   LORazepam (ATIVAN) 1 MG tablet    losartan (COZAAR) 25 MG tablet TAKE 1 TABLET (25 MG TOTAL) BY MOUTH DAILY.   NUVARING 0.12-0.015 MG/24HR vaginal ring INSERT 1 RING VAGINALLY AS DIRECTED. REMOVE AFTER 3 WEEKS & WAIT 7 DAYS BEFORE INSERTING A NEW RING   Omega-3 350 MG CPDR Take 1 capsule by mouth daily.   Urine Glucose-Ketones Test STRP Use 1-3 times daily as needed   vitamin B-12 (CYANOCOBALAMIN) 500 MCG tablet Take 500 mcg by mouth daily.   WELLBUTRIN XL 300 MG 24 hr tablet  No current facility-administered medications for this visit. (Other)   REVIEW OF SYSTEMS: ROS   Positive for: Endocrine, Eyes, Psychiatric Negative for: Constitutional, Gastrointestinal, Neurological, Skin, Genitourinary, Musculoskeletal, HENT, Cardiovascular, Respiratory, Allergic/Imm, Heme/Lymph Last edited by Thompson Grayer, COT on 01/28/2023  1:13 PM.      ALLERGIES Allergies  Allergen Reactions   Adhesive [Tape] Rash   Colophony [Pinus Strobus] Hives and Rash   Formaldehyde Hives and Rash    Like a burn   Latex Rash   Nickel Hives and Rash   Sulfa Antibiotics Rash   PAST MEDICAL HISTORY Past Medical History:  Diagnosis Date   Anxiety    Depression    Diabetes mellitus without complication [facility])    DMT1 age 20  Dr. Ernest Haber Corinda [place])   Pneumonia    2005   Past Surgical History:  Procedure Laterality Date   CERVICAL POLYPECTOMY N/A 04/25/2016   Procedure: CERVICAL POLYPECTOMY;  Surgeon: Carrington Clamp, MD;  Location: [facility];  Service: Gynecology;  Laterality: N/A;   HYSTEROSCOPY WITH D & C N/A 04/25/2016   Procedure: DILATATION AND CURETTAGE /HYSTEROSCOPY;  Surgeon: Carrington Clamp, MD;  Location: [facility];  Service: Gynecology;  Laterality: N/A;   MULTIPLE TOOTH EXTRACTIONS     RETINAL LASER PROCEDURE     TYMPANOSTOMY TUBE PLACEMENT  age 61   FAMILY HISTORY Family History  Problem Relation Age of Onset   Diabetes Mother    Hypertension Mother    Hyperlipidemia Mother    Thyroid disease Mother    Heart disease Mother    Diabetes Maternal Uncle    Hypertension Maternal Uncle    Hypertension Maternal Grandmother    Heart disease Maternal Grandmother    Diabetes Maternal Grandfather    Hypertension Maternal Grandfather    Hyperlipidemia Maternal Grandfather    SOCIAL HISTORY Social History   Tobacco Use   Smoking status: Former    Current packs/day: 0.00    Types: Cigarettes    Quit date: 07/14/2009    Years since quitting: 13.5   Smokeless tobacco: Never  Vaping Use   Vaping status: Never Used  Substance Use Topics   Alcohol use: Yes    Alcohol/week: 0.0 standard drinks of alcohol    Comment: occasionally   Drug use: No       OPHTHALMIC EXAM:  Base Eye Exam     Visual Acuity (Snellen - Linear)       Right Left   Dist cc 20/20 -2 20/25    Correction: Glasses         Tonometry (Tonopen, 1:16 PM)       Right Left   Pressure 12 15         Pupils       Pupils Dark Light Shape React APD   Right PERRL 5 4 Round Brisk None   Left PERRL 5 4 Round Brisk None         Visual Fields (Counting fingers)       Left Right    Full Full         Extraocular Movement       Right Left    Full, Ortho Full, Ortho         Neuro/Psych     Oriented  x3: Yes   Mood/Affect: Normal         Dilation     Both eyes: 1.0% Mydriacyl, 2.5% Phenylephrine @ 1:17 PM           Slit  Lamp and Fundus Exam     Slit Lamp Exam       Right Left   Lids/Lashes Normal Normal   Conjunctiva/Sclera White and quiet White and quiet   Cornea trace tear film debris, 1+ Punctate epithelial erosions, trace, fine endo pigment trace PEE, mild tear film debris, trace, fine endo pigment   Anterior Chamber deep and clear Deep and quiet   Iris Round and dilated, No NVI Round and dilated, No NVI   Lens Clear Punctate cortical changes, trace Nuclear sclerosis   Anterior Vitreous mild syneresis, Posterior vitreous detachment, vitreous condensations Vitreous syneresis, Posterior vitreous detachment, vitreous condensations         Fundus Exam       Right Left   Disc Pink and Sharp, trace Peripapillary atrophy, no NVD Pink and Sharp, Compact   C/D Ratio 0.2 0.2   Macula Flat, Good foveal reflex, Retinal pigment epithelial mottling, No heme or edema Flat, Good foveal reflex, Retinal pigment epithelial mottling, No heme or edema   Vessels mild attenuation, mild tortuosity, mild copper wiring attenuated, mild tortuosity, mild copper wiring   Periphery Attached, VR tuft at 0530 equator, superior lattice degeneration at 1100-0130 ora, peripheral lattice SN quadrant, paving stone  at 0400 -- good laser from 1100 to 0130 and 0700 equator, lattice with atrophic holes at 1000 -- good laser changes, No new RT/RD/lattice, No heme Attached, Lattice degeneration from 0500-0700, superior lattice from 1000 to 0200 ora - good laser surrounding all lesions, No new RT/RD/lattice, No heme           Refraction     Wearing Rx       Sphere Cylinder Axis   Right -10.25 +3.50 095   Left -9.00` +3.00 081           IMAGING AND PROCEDURES  Imaging and Procedures for @TODAY @  OCT, Retina - OU - Both Eyes       Right Eye Quality was good. Central Foveal Thickness: 300.  Progression has been stable. Findings include normal foveal contour, no IRF, no SRF, vitreomacular adhesion .   Left Eye Quality was good. Central Foveal Thickness: 304. Progression has been stable. Findings include normal foveal contour, no IRF, no SRF.   Notes *Images captured and stored on drive  Diagnosis / Impression:  NFP, No IRF/SRF OU  Clinical management:  See below  Abbreviations: NFP - Normal foveal profile. CME - cystoid macular edema. PED - pigment epithelial detachment. IRF - intraretinal fluid. SRF - subretinal fluid. EZ - ellipsoid zone. ERM - epiretinal membrane. ORA - outer retinal atrophy. ORT - outer retinal tubulation. SRHM - subretinal hyper-reflective material             ASSESSMENT/PLAN:    ICD-10-CM   1. 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 Retinal holes, bilateral  H33.323     4. High myopia, bilateral  H52.13       1. Diabetes mellitus, type 1 without retinopathy  - last A1c was 8.2 on 03.05.24, 7.4 on 05.04.23, 7.6 on 03.29.22  - The incidence, risk factors for progression, natural history and treatment options for diabetic retinopathy were discussed with patient.    - The need for close monitoring of blood glucose, blood pressure, and serum lipids, avoiding cigarette or any type of tobacco, and the need for long term follow  up was also discussed with patient.  - f/u in 1 year, sooner prn  2,3. Lattice degeneration with atrophic retinal holes, OU -   - RU:[id number] lattice at ora, peripheral lattice SN quadrant, VR tuft at 530 equator OD  - new lattice with atrophic holes at 1000 OD noted 01.15.21  - OS: superior lattice from 1000 to 0200, lattice from 0500 to 0700 OS  - S/P laser retinopexy OD (10.04.19, 01.15.21) -- good laser surrounding lattice  - S/P laser retinopexy OS (11.01.19) -- good laser in place  - no new RT/RD or  lattice  - f/u 1 year  4. High Myopia OU-   - discussed association of myopia with lattice degen and RT/RD  - doing well w/ current specs  - under the expert care of Dr. Dione Booze  Ophthalmic Meds Ordered this visit:  No orders of the defined types were placed in this encounter.    Return in about 1 year (around 01/28/2024) for f/u DM exam, DFE, OCT.  There are no Patient Instructions on file for this visit.  Explained the diagnoses, plan, and follow up with the patient and they expressed understanding.  Patient expressed understanding of the importance of proper follow up care.   This document serves as a record of services personally performed by Karie Chimera, MD, PhD. It was created on their behalf by De Blanch, an ophthalmic technician. The creation of this record is the provider's dictation and/or activities during the visit.    Electronically signed by: De Blanch, OA, 01/31/23  9:24 PM  This document serves as a record of services personally performed by Karie Chimera, MD, PhD. It was created on their behalf by Glee Arvin. Manson Passey, OA an ophthalmic technician. The creation of this record is the provider's dictation and/or activities during the visit.    Electronically signed by: Glee Arvin. Manson Passey, OA 01/31/23 9:24 PM  Karie Chimera, M.D., Ph.D. Diseases & Surgery of the Retina and Vitreous [facility]  I have reviewed the above documentation for accuracy and completeness, and I agree with the above. Karie Chimera, M.D., Ph.D. 01/31/23 9:25 PM   Abbreviations: M myopia (nearsighted); A astigmatism; H hyperopia (farsighted); P presbyopia; Mrx spectacle prescription;  CTL contact lenses; OD right eye; OS left eye; OU both eyes  XT exotropia; ET esotropia; PEK punctate epithelial keratitis; PEE punctate epithelial erosions; DES dry eye syndrome; MGD meibomian gland dysfunction; ATs artificial tears; PFAT's preservative free artificial tears; NSC  nuclear sclerotic cataract; PSC posterior subcapsular cataract; ERM epi-retinal membrane; PVD posterior vitreous detachment; RD retinal detachment; DM diabetes mellitus; DR diabetic retinopathy; NPDR non-proliferative diabetic retinopathy; PDR proliferative diabetic retinopathy; CSME clinically significant macular edema; DME diabetic macular edema; dbh dot blot hemorrhages; CWS cotton wool spot; POAG primary open angle glaucoma; C/D cup-to-disc ratio; HVF humphrey visual field; GVF goldmann visual field; OCT optical coherence tomography; IOP intraocular pressure; BRVO Branch retinal vein occlusion; CRVO central retinal vein occlusion; CRAO central retinal artery occlusion; BRAO branch retinal artery occlusion; RT retinal tear; SB scleral buckle; PPV pars plana vitrectomy; VH Vitreous hemorrhage; PRP panretinal laser photocoagulation; IVK intravitreal kenalog; VMT vitreomacular traction; MH Macular hole;  NVD neovascularization of the disc; NVE neovascularization elsewhere; AREDS age related eye disease study; ARMD age related macular degeneration; POAG primary open angle glaucoma; EBMD epithelial/anterior basement membrane dystrophy; ACIOL anterior chamber intraocular lens; IOL intraocular lens; PCIOL posterior chamber intraocular lens; Phaco/IOL phacoemulsification with intraocular lens placement; PRK photorefractive keratectomy;  LASIK laser assisted in situ keratomileusis; HTN hypertension; DM diabetes mellitus; COPD chronic obstructive pulmonary disease

## 2023-01-27 ENCOUNTER — Telehealth: Payer: Self-pay | Admitting: Nutrition

## 2023-01-27 NOTE — Telephone Encounter (Signed)
LVM to see if she was able to get her pump upgraded.  Told her to call Thayer Ohm if still having problems with this and he will do this with her. Telphone number given

## 2023-01-28 ENCOUNTER — Encounter (INDEPENDENT_AMBULATORY_CARE_PROVIDER_SITE_OTHER): Payer: Self-pay | Admitting: Ophthalmology

## 2023-01-28 ENCOUNTER — Ambulatory Visit (INDEPENDENT_AMBULATORY_CARE_PROVIDER_SITE_OTHER): Payer: BC Managed Care – PPO | Admitting: Ophthalmology

## 2023-01-28 DIAGNOSIS — E109 Type 1 diabetes mellitus without complications: Secondary | ICD-10-CM

## 2023-01-28 DIAGNOSIS — H33323 Round hole, bilateral: Secondary | ICD-10-CM

## 2023-01-28 DIAGNOSIS — H35413 Lattice degeneration of retina, bilateral: Secondary | ICD-10-CM

## 2023-01-28 DIAGNOSIS — H5213 Myopia, bilateral: Secondary | ICD-10-CM

## 2023-01-31 ENCOUNTER — Encounter (INDEPENDENT_AMBULATORY_CARE_PROVIDER_SITE_OTHER): Payer: Self-pay | Admitting: Ophthalmology

## 2023-02-09 ENCOUNTER — Other Ambulatory Visit: Payer: Self-pay | Admitting: Internal Medicine

## 2023-02-09 ENCOUNTER — Ambulatory Visit (INDEPENDENT_AMBULATORY_CARE_PROVIDER_SITE_OTHER): Payer: BC Managed Care – PPO | Admitting: Family Medicine

## 2023-02-09 ENCOUNTER — Ambulatory Visit
Admission: RE | Admit: 2023-02-09 | Discharge: 2023-02-09 | Disposition: A | Discharge status: Home / Self Care | Payer: BC Managed Care – PPO | Source: Ambulatory Visit | Attending: Family Medicine | Admitting: Family Medicine

## 2023-02-09 ENCOUNTER — Encounter: Payer: Self-pay | Admitting: Family Medicine

## 2023-02-09 VITALS — BP 122/76 | HR 114 | Temp 98.4°F | Resp 19 | Ht 68.0 in | Wt 239.1 lb

## 2023-02-09 DIAGNOSIS — R052 Subacute cough: Secondary | ICD-10-CM

## 2023-02-09 MED ORDER — MONTELUKAST SODIUM 10 MG PO TABS
10.0000 mg | ORAL_TABLET | Freq: Every day | ORAL | 3 refills | Status: DC
Start: 2023-02-09 — End: 2023-05-11

## 2023-02-09 NOTE — Addendum Note (Signed)
Addended by: Sheliah Hatch on: 02/09/2023 04:52 PM   Modules accepted: Orders

## 2023-02-09 NOTE — Progress Notes (Signed)
   Subjective:    Patient ID: Kristen Carrillo, female    DOB: Dec 04, 1985, 37 y.o.   MRN: [id number]  HPI Cough- pt reports cough has been ongoing since the beginning of the year.  Will start to improve and then worsen, sounding like there is wet chest congestion.  No fever.  Occasional SOB.  No wheezing.  Cough is occasionally productive.  Taking OTC allergy med.  No improvement w/ prescribed nasal spray.   Review of Systems For ROS see HPI     Objective:   Physical Exam Vitals reviewed.  Constitutional:      General: She is not in acute distress.    Appearance: She is well-developed. She is not ill-appearing.  HENT:     Head: Normocephalic and atraumatic.     Right Ear: Tympanic membrane and ear canal normal.     Left Ear: Tympanic membrane and ear canal normal.     Nose: Congestion present. No rhinorrhea.  Eyes:     Extraocular Movements: EOM normal.     Conjunctiva/sclera: Conjunctivae normal.     Pupils: Pupils are equal, round, and reactive to light.  Cardiovascular:     Rate and Rhythm: Normal rate and regular rhythm.     Heart sounds: Normal heart sounds. No murmur heard. Pulmonary:     Effort: Pulmonary effort is normal. No respiratory distress.     Breath sounds: Normal breath sounds. No wheezing or rhonchi.  Musculoskeletal:     Cervical back: Normal range of motion and neck supple.  Lymphadenopathy:     Cervical: No cervical adenopathy.  Skin:    General: Skin is warm and dry.  Neurological:     General: No focal deficit present.     Mental Status: She is alert and oriented to person, place, and time.  Psychiatric:        Mood and Affect: Mood normal.        Behavior: Behavior normal.        Thought Content: Thought content normal.           Assessment & Plan:   Subacute cough- pt reports this cough has been waxing and waning since the first of the year.  She will think it's getting better but then worsen again.  Cough is intermittently productive,  will sound wet, associated w/ nasal congestion.  Is on a daily antihistamine for allergies.  At higher risk of infxn due to DM.  Will get CXR to assess.  If CXR clear, will add Singulair.  If CXR shows bronchitis or inflammation or other issue, will treat appropriately.  Pt expressed understanding and is in agreement w/ plan.

## 2023-02-09 NOTE — Patient Instructions (Signed)
Follow up as needed or as scheduled Go get your chest xray and we'll decide on the next steps Continue to take your daily allergy medication IF there's anything on xray- we'll start antibiotics If not, we'll add Singulair to help w/ the untreated part of the allergies Call with any questions or concerns Stay Safe!  Stay Healthy! Hang in there!!!

## 2023-02-10 ENCOUNTER — Telehealth: Payer: Self-pay

## 2023-02-10 NOTE — Telephone Encounter (Signed)
-----   Message from Neena Rhymes sent at 02/09/2023  4:52 PM EDT ----- Normal chest xray- good news!  Based on this, we'll start the Singulair to treat the other allergy pathway.  I sent this to the pharmacy and you can pick it up at your convenience.

## 2023-02-10 NOTE — Telephone Encounter (Signed)
Pt seen results via my chart  

## 2023-02-20 ENCOUNTER — Other Ambulatory Visit: Payer: Self-pay | Admitting: Family Medicine

## 2023-02-26 LAB — HM DIABETES EYE EXAM

## 2023-03-01 ENCOUNTER — Encounter: Payer: Self-pay | Admitting: Internal Medicine

## 2023-03-02 ENCOUNTER — Telehealth: Payer: Self-pay | Admitting: Nutrition

## 2023-03-02 NOTE — Telephone Encounter (Signed)
LVM that I can give her a few pump cartridges and infusion sets to tide her over until her Felisa Bonier order comes in.  Left number to call me with what kind of infusion sets she uses.

## 2023-03-12 ENCOUNTER — Encounter: Payer: Self-pay | Admitting: Internal Medicine

## 2023-03-20 ENCOUNTER — Other Ambulatory Visit: Payer: Self-pay | Admitting: Family Medicine

## 2023-04-22 ENCOUNTER — Telehealth: Payer: Self-pay

## 2023-04-22 NOTE — Telephone Encounter (Signed)
Sample  Devices: AutoSoftXC infusion set, Tslim X2 Cartridge, 3mL Disposable Syringe Quantity:3 each  Dicie Beam

## 2023-04-28 NOTE — Telephone Encounter (Signed)
Patient's mother picked up samples.

## 2023-04-29 ENCOUNTER — Telehealth: Payer: Self-pay

## 2023-04-29 NOTE — Telephone Encounter (Signed)
Sample  Device: FreeStyle Libre 2 plus  Quantity:2 JWJ:[id number] EXP:06/13/23  Dicie Beam

## 2023-05-07 ENCOUNTER — Other Ambulatory Visit: Payer: Self-pay | Admitting: Family Medicine

## 2023-05-10 ENCOUNTER — Other Ambulatory Visit: Payer: Self-pay | Admitting: Family Medicine

## 2023-05-19 ENCOUNTER — Ambulatory Visit: Payer: BC Managed Care – PPO | Admitting: Internal Medicine

## 2023-05-19 ENCOUNTER — Encounter: Payer: Self-pay | Admitting: Internal Medicine

## 2023-05-19 VITALS — BP 124/70 | HR 105 | Ht 68.0 in | Wt 230.2 lb

## 2023-05-19 DIAGNOSIS — E10649 Type 1 diabetes mellitus with hypoglycemia without coma: Secondary | ICD-10-CM | POA: Diagnosis not present

## 2023-05-19 DIAGNOSIS — E1069 Type 1 diabetes mellitus with other specified complication: Secondary | ICD-10-CM

## 2023-05-19 DIAGNOSIS — E785 Hyperlipidemia, unspecified: Secondary | ICD-10-CM
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
## 2023-05-19 NOTE — Patient Instructions (Addendum)
Please use the following settings: - basal rates: 12 am: 2.0 5 am:  2.0 8 am: 1.8   11 am: 1.95 2 pm: 1.95 4 pm: 1.95 10 pm: 1.8 - ICR:  12 am: 5.0 4 am: 4.5 11 am: 4.5  5 pm: 4.0 8:30 pm: 4.0 >> 3.7 10 pm: 5 >> 4.5 - target: 110-110 - ISF: 40, except 50 between 12 am-5 pm - Insulin on Board: 3h  Try to stop the sleep mode.  If sugars after 9 pm remain high, increase the basal rate from 9 pm to 6 am by 0.1 u/h.   Please return in 4 months.

## 2023-05-20 LAB — LIPID PANEL
Cholesterol: 146 mg/dL (ref 0–200)
HDL: 55.1 mg/dL (ref >39.00)
LDL Cholesterol: 71 mg/dL (ref 0–99)
NonHDL: 91.26
Total CHOL/HDL Ratio: 3
Triglycerides: 99 mg/dL (ref 0.0–149.0)
VLDL: 19.8 mg/dL (ref 0.0–40.0)

## 2023-05-20 LAB — COMPREHENSIVE METABOLIC PANEL
ALT: 13 U/L (ref 0–35)
AST: 14 U/L (ref 0–37)
Albumin: 3.7 g/dL (ref 3.5–5.2)
Alkaline Phosphatase: 82 U/L (ref 39–117)
BUN: 16 mg/dL (ref 6–23)
CO2: 26 meq/L (ref 19–32)
Calcium: 9.2 mg/dL (ref 8.4–10.5)
Chloride: 100 meq/L (ref 96–112)
Creatinine, Ser: 1.07 mg/dL (ref 0.40–1.20)
GFR: 66.97 mL/min (ref >60.00)
Glucose, Bld: 264 mg/dL — ABNORMAL HIGH (ref 70–99)
Potassium: 4.2 meq/L (ref 3.5–5.1)
Sodium: 136 meq/L (ref 135–145)
Total Bilirubin: 0.3 mg/dL (ref 0.2–1.2)
Total Protein: 7.1 g/dL (ref 6.0–8.3)

## 2023-05-20 LAB — MICROALBUMIN / CREATININE URINE RATIO
Creatinine,U: 196.9 mg/dL
Microalb Creat Ratio: 0.4 mg/g (ref 0.0–30.0)
Microalb, Ur: <0.7 mg/dL (ref 0.0–1.9)

## 2023-05-20 LAB — TSH: TSH: 2.43 u[IU]/mL (ref 0.35–5.50)

## 2023-05-26 ENCOUNTER — Encounter: Payer: Self-pay | Admitting: Internal Medicine

## 2023-05-26 NOTE — Telephone Encounter (Signed)
Are you aware of an emergency plan? Please advise

## 2023-06-01 ENCOUNTER — Ambulatory Visit: Payer: BC Managed Care – PPO | Admitting: Family Medicine

## 2023-06-01 ENCOUNTER — Encounter: Payer: Self-pay | Admitting: Family Medicine

## 2023-06-01 VITALS — BP 118/80 | HR 104 | Temp 97.9°F | Ht 68.0 in | Wt 231.2 lb

## 2023-06-01 DIAGNOSIS — R051 Acute cough: Secondary | ICD-10-CM

## 2023-06-01 MED ORDER — AZITHROMYCIN 250 MG PO TABS: ORAL_TABLET | ORAL | 0 refills | Status: DC

## 2023-06-01 MED ORDER — PROMETHAZINE-DM 6.25-15 MG/5ML PO SYRP: 5.0000 mL | ORAL_SOLUTION | Freq: Four times a day (QID) | ORAL | 0 refills | Status: DC | PRN

## 2023-06-01 NOTE — Progress Notes (Signed)
   Subjective:    Patient ID: Kristen Carrillo, female    DOB: 1986-06-10, 37 y.o.   MRN: [id number]  HPI Cough- pt reports cough started early October.  Then had laryngitis x2 weeks.  Voice is slowly improving.  Cough is productive.  No fever.  Denies HA.  At times has trouble catching her breath.  + sick contacts.   Review of Systems For ROS see HPI     Objective:   Physical Exam Vitals reviewed.  Constitutional:      General: She is not in acute distress.    Appearance: Normal appearance. She is not ill-appearing.  HENT:     Head: Normocephalic and atraumatic.     Right Ear: Tympanic membrane and ear canal normal.     Left Ear: Tympanic membrane and ear canal normal.     Nose: Nose normal. No congestion.     Mouth/Throat:     Mouth: Mucous membranes are moist.     Pharynx: No oropharyngeal exudate or posterior oropharyngeal erythema.  Eyes:     Conjunctiva/sclera: Conjunctivae normal.  Cardiovascular:     Rate and Rhythm: Normal rate and regular rhythm.  Pulmonary:     Effort: Pulmonary effort is normal. No respiratory distress.     Breath sounds: No wheezing or rhonchi.  Musculoskeletal:     Cervical back: Neck supple.  Lymphadenopathy:     Cervical: No cervical adenopathy.  Skin:    General: Skin is warm and dry.  Neurological:     General: No focal deficit present.     Mental Status: She is alert and oriented to person, place, and time.  Psychiatric:        Mood and Affect: Mood normal.        Behavior: Behavior normal.        Thought Content: Thought content normal.           Assessment & Plan:   Acute cough- pt's cough started ~1 month ago.  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  Given the surge in community mycoplasma infxn, will start Zpack to cover for atypical infxn.  Cough meds prn.  Reviewed supportive care and red flags that should prompt return.  Pt expressed understanding and is in agreement w/ plan.

## 2023-06-01 NOTE — Patient Instructions (Signed)
Follow up as needed or as scheduled START the Azithromycin as directed USE the cough syrup as needed for nights and weekends Robitussin or Delsym for daytime cough Drink LOTS of fluids REST! Call with any questions or concerns Hang in there!!!

## 2023-06-02 NOTE — Telephone Encounter (Signed)
Samples never picked up. Placed back in the stock.

## 2023-06-17 ENCOUNTER — Encounter: Payer: Self-pay | Admitting: Internal Medicine

## 2023-07-02 NOTE — Telephone Encounter (Signed)
Records abstracted to [facility]

## 2023-07-20 ENCOUNTER — Encounter: Payer: Self-pay | Admitting: Internal Medicine

## 2023-08-05 ENCOUNTER — Encounter: Payer: Self-pay | Admitting: Internal Medicine

## 2023-08-05 DIAGNOSIS — E10649 Type 1 diabetes mellitus with hypoglycemia without coma: Secondary | ICD-10-CM

## 2023-08-11 ENCOUNTER — Telehealth: Payer: Self-pay

## 2023-08-11 NOTE — Telephone Encounter (Signed)
Sample  Device/Supplies: FreeStyle Libre 2 plus  Quantity:1   WUJ:[id number] EXP:10/12/23  Dicie Beam

## 2023-09-02 MED ORDER — ONETOUCH ULTRA VI STRP: ORAL_STRIP | 12 refills | Status: AC

## 2023-09-02 MED ORDER — ONETOUCH ULTRA MINI W/DEVICE KIT: PACK | 0 refills | Status: AC

## 2023-09-02 MED ORDER — ONETOUCH ULTRASOFT LANCETS MISC: 12 refills | Status: AC

## 2023-09-17 ENCOUNTER — Ambulatory Visit: Payer: BC Managed Care – PPO | Admitting: Internal Medicine

## 2023-09-17 ENCOUNTER — Other Ambulatory Visit: Payer: Self-pay | Admitting: Family Medicine

## 2023-10-20 ENCOUNTER — Encounter: Payer: Self-pay | Admitting: Internal Medicine

## 2023-10-20 ENCOUNTER — Ambulatory Visit: Payer: BC Managed Care – PPO | Admitting: Internal Medicine

## 2023-10-20 VITALS — BP 118/78 | HR 97 | Ht 68.0 in | Wt 228.6 lb

## 2023-10-20 DIAGNOSIS — E66811 Obesity, class 1: Secondary | ICD-10-CM | POA: Diagnosis not present

## 2023-10-20 DIAGNOSIS — E1069 Type 1 diabetes mellitus with other specified complication: Secondary | ICD-10-CM

## 2023-10-20 DIAGNOSIS — E785 Hyperlipidemia, unspecified: Secondary | ICD-10-CM | POA: Diagnosis not present

## 2023-10-20 DIAGNOSIS — E10649 Type 1 diabetes mellitus with hypoglycemia without coma: Secondary | ICD-10-CM

## 2023-10-20 MED ORDER — GLUCAGON 3 MG/DOSE NA POWD: 3.0000 mg | Freq: Once | NASAL | 11 refills | Status: AC | PRN

## 2023-10-20 MED ORDER — FIASP 100 UNIT/ML IJ SOLN: INTRAMUSCULAR | 3 refills | Status: DC
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
## 2023-10-20 NOTE — Patient Instructions (Addendum)
 Please use the following settings: - basal rates: 12 am: 2.0 >> 2.3 5 am:  2.0 8 am: 1.8   11 am: 1.95 2 pm: 1.95 4 pm: 1.95 10 pm: 1.8 - ICR:  12 am: 5.0 >> 4.5 4 am: 4.5 11 am: 4.5  5 pm: 4.0 8:30 pm: 3.7 10 pm: 4.5 >> 4.0 - target: 110-110 - ISF: 40, except 50 between 12 am-5 pm - Insulin on Board: 3h   Please return in 4 months.

## 2023-11-04 ENCOUNTER — Other Ambulatory Visit: Payer: Self-pay | Admitting: Family Medicine

## 2023-11-26 ENCOUNTER — Telehealth: Payer: Self-pay

## 2023-11-26 ENCOUNTER — Other Ambulatory Visit: Payer: Self-pay | Admitting: Family Medicine

## 2023-11-26 NOTE — Telephone Encounter (Signed)
 Left vm

## 2023-11-26 NOTE — Telephone Encounter (Signed)
Pt called back and got scheduled

## 2023-12-10 ENCOUNTER — Encounter: Payer: Self-pay | Admitting: Family Medicine

## 2023-12-10 ENCOUNTER — Ambulatory Visit (INDEPENDENT_AMBULATORY_CARE_PROVIDER_SITE_OTHER): Admitting: Family Medicine

## 2023-12-10 VITALS — BP 118/78 | HR 94 | Temp 99.1°F | Ht 68.0 in | Wt 227.4 lb

## 2023-12-10 DIAGNOSIS — Z1159 Encounter for screening for other viral diseases: Secondary | ICD-10-CM

## 2023-12-10 DIAGNOSIS — E559 Vitamin D deficiency, unspecified: Secondary | ICD-10-CM | POA: Diagnosis not present

## 2023-12-10 DIAGNOSIS — Z0000 Encounter for general adult medical examination without abnormal findings: Secondary | ICD-10-CM | POA: Diagnosis not present

## 2023-12-10 DIAGNOSIS — E10649 Type 1 diabetes mellitus with hypoglycemia without coma: Secondary | ICD-10-CM | POA: Diagnosis not present

## 2023-12-10 LAB — HEPATIC FUNCTION PANEL
ALT: 23 U/L (ref 0–35)
AST: 21 U/L (ref 0–37)
Albumin: 3.8 g/dL (ref 3.5–5.2)
Alkaline Phosphatase: 75 U/L (ref 39–117)
Bilirubin, Direct: 0.1 mg/dL (ref 0.0–0.3)
Total Bilirubin: 0.5 mg/dL (ref 0.2–1.2)
Total Protein: 7.2 g/dL (ref 6.0–8.3)

## 2023-12-10 LAB — BASIC METABOLIC PANEL WITH GFR
BUN: 15 mg/dL (ref 6–23)
CO2: 26 meq/L (ref 19–32)
Calcium: 9.3 mg/dL (ref 8.4–10.5)
Chloride: 102 meq/L (ref 96–112)
Creatinine, Ser: 0.72 mg/dL (ref 0.40–1.20)
GFR: 107.3 mL/min (ref >60.00)
Glucose, Bld: 201 mg/dL — ABNORMAL HIGH (ref 70–99)
Potassium: 4.3 meq/L (ref 3.5–5.1)
Sodium: 135 meq/L (ref 135–145)

## 2023-12-10 LAB — CBC WITH DIFFERENTIAL/PLATELET
Basophils Absolute: 0.1 10*3/uL (ref 0.0–0.1)
Basophils Relative: 0.8 % (ref 0.0–3.0)
Eosinophils Absolute: 0.2 10*3/uL (ref 0.0–0.7)
Eosinophils Relative: 2.9 % (ref 0.0–5.0)
HCT: 42.8 % (ref 36.0–46.0)
Hemoglobin: 14.3 g/dL (ref 12.0–15.0)
Lymphocytes Relative: 44.8 % (ref 12.0–46.0)
Lymphs Abs: 3.5 10*3/uL (ref 0.7–4.0)
MCHC: 33.3 g/dL (ref 30.0–36.0)
MCV: 82 fl (ref 78.0–100.0)
Monocytes Absolute: 0.5 10*3/uL (ref 0.1–1.0)
Monocytes Relative: 6.4 % (ref 3.0–12.0)
Neutro Abs: 3.5 10*3/uL (ref 1.4–7.7)
Neutrophils Relative %: 45.1 % (ref 43.0–77.0)
Platelets: 375 10*3/uL (ref 150.0–400.0)
RBC: 5.22 Mil/uL — ABNORMAL HIGH (ref 3.87–5.11)
RDW: 13.5 % (ref 11.5–15.5)
WBC: 7.8 10*3/uL (ref 4.0–10.5)

## 2023-12-10 LAB — TSH: TSH: 2.29 u[IU]/mL (ref 0.35–5.50)

## 2023-12-10 LAB — LIPID PANEL
Cholesterol: 170 mg/dL (ref 0–200)
HDL: 70.9 mg/dL (ref >39.00)
LDL Cholesterol: 89 mg/dL (ref 0–99)
NonHDL: 99.31
Total CHOL/HDL Ratio: 2
Triglycerides: 51 mg/dL (ref 0.0–149.0)
VLDL: 10.2 mg/dL (ref 0.0–40.0)

## 2023-12-10 LAB — HEMOGLOBIN A1C: Hgb A1c MFr Bld: 7.3 % — ABNORMAL HIGH (ref 4.6–6.5)

## 2023-12-10 LAB — VITAMIN D 25 HYDROXY (VIT D DEFICIENCY, FRACTURES): VITD: 77.04 ng/mL (ref 30.00–100.00)

## 2023-12-10 NOTE — Assessment & Plan Note (Signed)
 Pt's PE WNL w/ exception of BMI.  UTD on pap, Tdap, eye exam, microalbumin, foot exam.  Due for A1C (sees Endo).  Check labs.  Anticipatory guidance provided.

## 2023-12-10 NOTE — Progress Notes (Signed)
 Subjective:    Patient ID: Kristen Carrillo, female    DOB: 05/13/1986, 38 y.o.   MRN: [id number]  HPI CPE- UTD on eye exam, microalbumin, foot exam, pap.  Due for A1C.  Patient Care Team    Relationship Specialty Notifications Start End  Jess Morita, MD PCP - General Family Medicine  10/11/15   Matt Song, MD Consulting Physician Obstetrics and Gynecology  10/11/15   Emilie Harden, MD Consulting Physician Internal Medicine  10/11/15   Peri Brackett, OD Physician Assistant Optometry  10/11/15   Benn Brash, MD Referring Physician Anesthesiology  10/11/15   Linde Reveal, NP Nurse Practitioner   10/11/15   [facility]    06/01/23     Health Maintenance  Topic Date Due   Hepatitis C Screening  Never done   HEMOGLOBIN A1C  07/16/2023   COVID-19 Vaccine (4 - 2024-25 season) 12/26/2023 (Originally 03/15/2023)   DTaP/Tdap/Td (2 - Td or Tdap) 12/09/2024 (Originally 07/15/2023)   Pneumococcal Vaccine 22-57 Years old (1 of 2 - PCV) 12/09/2024 (Originally 03/03/2006)   INFLUENZA VACCINE  02/12/2024   OPHTHALMOLOGY EXAM  02/26/2024   Diabetic kidney evaluation - eGFR measurement  05/18/2024   Diabetic kidney evaluation - Urine ACR  05/18/2024   FOOT EXAM  10/19/2024   Cervical Cancer Screening (HPV/Pap Cotest)  12/29/2025   HPV VACCINES  Completed   HIV Screening  Completed   Meningococcal B Vaccine  Aged Out      Review of Systems Patient reports no vision/ hearing changes, adenopathy,fever, weight change,  persistant/recurrent hoarseness , swallowing issues, chest pain, palpitations, edema, persistant/recurrent cough, hemoptysis, dyspnea (rest/exertional/paroxysmal nocturnal), gastrointestinal bleeding (melena, rectal bleeding), abdominal pain, significant heartburn, bowel changes, GU symptoms (dysuria, hematuria, incontinence), Gyn symptoms (abnormal  bleeding, pain),  syncope, focal weakness, memory loss, skin/hair/nail changes, abnormal bruising or  bleeding, anxiety, or depression.   + numbness of hands- carpal tunnel    Objective:   Physical Exam General Appearance:    Alert, cooperative, no distress, appears stated age, obese  Head:    Normocephalic, without obvious abnormality, atraumatic  Eyes:    PERRL, conjunctiva/corneas clear, EOM's intact both eyes  Ears:    Normal TM's and external ear canals, both ears  Nose:   Nares normal, septum midline, mucosa normal, no drainage    or sinus tenderness  Throat:   Lips, mucosa, and tongue normal; teeth and gums normal  Neck:   Supple, symmetrical, trachea midline, no adenopathy;    Thyroid : no enlargement/tenderness/nodules  Back:     Symmetric, no curvature, ROM normal, no CVA tenderness  Lungs:     Clear to auscultation bilaterally, respirations unlabored  Chest Wall:    No tenderness or deformity   Heart:    Regular rate and rhythm, S1 and S2 normal, no murmur, rub   or gallop  Breast Exam:    Deferred to GYN  Abdomen:     Soft, non-tender, bowel sounds active all four quadrants,    no masses, no organomegaly  Genitalia:    Deferred to GYN  Rectal:    Extremities:   Extremities normal, atraumatic, no cyanosis or edema  Pulses:   2+ and symmetric all extremities  Skin:   Skin color, texture, turgor normal, no rashes or lesions  Lymph nodes:   Cervical, supraclavicular, and axillary nodes normal  Neurologic:   CNII-XII intact, normal strength, sensation and reflexes    throughout  Assessment & Plan:

## 2023-12-10 NOTE — Patient Instructions (Signed)
Follow up in 6 months to recheck blood pressure and cholesterol We'll notify you of your lab results and make any changes if needed Continue to work on healthy diet and regular exercise- you can do it! Call with any questions or concerns Stay Safe!  Stay Healthy! Have a great summer!!!

## 2023-12-11 LAB — HEPATITIS C ANTIBODY: Hepatitis C Ab: NONREACTIVE

## 2023-12-14 ENCOUNTER — Ambulatory Visit: Payer: Self-pay | Admitting: Family Medicine

## 2023-12-17 ENCOUNTER — Ambulatory Visit: Admitting: Family Medicine

## 2023-12-18 ENCOUNTER — Other Ambulatory Visit: Payer: Self-pay | Admitting: Family Medicine

## 2024-01-10 ENCOUNTER — Encounter: Payer: Self-pay | Admitting: Internal Medicine

## 2024-01-19 NOTE — Progress Notes (Signed)
 [facility] - Clinic Note  01/25/2024     CHIEF COMPLAINT Patient presents for Retina Follow Up   HISTORY OF PRESENT ILLNESS: Kristen Carrillo is a 38 y.o. female who presents to the clinic today for:   HPI     Retina Follow Up   Patient presents with  Diabetic Retinopathy.  In both eyes.  This started 1 year ago.  Duration of 1 year.  Since onset it is stable.  I, the attending physician,  performed the HPI with the patient and updated documentation appropriately.        Comments   1 year retina follow up no vision changes noticed she denies any flashes or floaters her last reading 140 this am       Last edited by Valdemar Rogue, MD on 01/28/2024 11:03 PM.    Pt states she is not having any problems with her vision, she states over spring break she poked herself in the eye with her apple pencil and went to see Dr. Octavia, she had a corneal abrasion and was put on drops  Referring physician: Octavia Charlie Hamilton, MD [address] [place],  [place] [address]  HISTORICAL INFORMATION:   Selected notes from the MEDICAL RECORD NUMBER Referred by Dr. Hamilton Octavia for concern of lattice degeneration OU and atrophic holes OD LEE: 09.03.19 (S. Groat) [BCVA: OD: 20/25 OS:20/25] Ocular Hx-high myopia PMH-anxiety, depression, OCD, DM1 (takes novolog )    CURRENT MEDICATIONS: No current outpatient medications on file. (Ophthalmic Drugs)   No current facility-administered medications for this visit. (Ophthalmic Drugs)   Current Outpatient Medications (Other)  Medication Sig   amphetamine-dextroamphetamine (ADDERALL) 20 MG tablet 2 tablets in the morning and 1 in the evening.   atorvastatin  (LIPITOR) 20 MG tablet TAKE 1 TABLET BY MOUTH EVERY DAY   Blood Glucose Monitoring Suppl (ONE TOUCH ULTRA MINI) w/Device KIT Use to check blood sugar 3 times a day   Cholecalciferol (VITAMIN D3) 5000 units CAPS Take 1 capsule by mouth daily.   Dextroamphetamine Sulfate 20 MG  TABS Take 20 mg by mouth 1 day or 1 dose.   famotidine  (PEPCID ) 40 MG tablet TAKE 1 TABLET BY MOUTH EVERY DAY   Glucagon  3 MG/DOSE POWD Place 3 mg into the nose once as needed for up to 1 dose.   glucose blood (ONETOUCH ULTRA) test strip Use to check blood sugar 3 times a day   insulin  aspart (FIASP  FLEXTOUCH) 100 UNIT/ML FlexTouch Pen Inject 45-55 Units into the skin 3 (three) times daily before meals.   Insulin  Aspart, w/Niacinamide , (FIASP ) 100 UNIT/ML SOLN Use up to 120 units per day in the insulin  pump   insulin  glargine (LANTUS  SOLOSTAR) 100 UNIT/ML Solostar Pen Inject 35-40 Units into the skin at bedtime.   Insulin  Pen Needle 32G X 4 MM MISC Use 4x a day   Insulin  Syringe-Needle U-100 (BD SAFETYGLIDE INSULIN  SYRINGE) 31G X 15/64 1 ML MISC Use as instructed to inject insulin    Lancets (ONETOUCH ULTRASOFT) lancets Use to check blood sugar 3 times a day   LORazepam (ATIVAN) 1 MG tablet    losartan  (COZAAR ) 25 MG tablet TAKE 1 TABLET (25 MG TOTAL) BY MOUTH DAILY.   montelukast  (SINGULAIR ) 10 MG tablet TAKE 1 TABLET BY MOUTH EVERYDAY AT BEDTIME   NUVARING 0.12-0.015 MG/24HR vaginal ring INSERT 1 RING VAGINALLY AS DIRECTED. REMOVE AFTER 3 WEEKS & WAIT 7 DAYS BEFORE INSERTING A NEW RING   Omega-3 350 MG  CPDR Take 1 capsule by mouth daily.   sertraline (ZOLOFT) 25 MG tablet Take 25 mg by mouth daily.   Urine Glucose-Ketones Test STRP Use 1-3 times daily as needed   vitamin B-12 (CYANOCOBALAMIN) 500 MCG tablet Take 500 mcg by mouth daily.   WELLBUTRIN XL 300 MG 24 hr tablet    No current facility-administered medications for this visit. (Other)   REVIEW OF SYSTEMS: ROS   Positive for: Endocrine, Eyes, Psychiatric Negative for: Constitutional, Gastrointestinal, Neurological, Skin, Genitourinary, Musculoskeletal, HENT, Cardiovascular, Respiratory, Allergic/Imm, Heme/Lymph Last edited by Resa Delon ORN, COT on 01/25/2024 12:54 PM.       ALLERGIES Allergies  Allergen Reactions    Adhesive [Tape] Rash   Colophony [Pinus Strobus] Hives and Rash   Formaldehyde Hives and Rash    Like a burn   Latex Rash   Nickel Hives and Rash   Sulfa Antibiotics Rash   PAST MEDICAL HISTORY Past Medical History:  Diagnosis Date   Anxiety    Depression    Diabetes mellitus without complication [facility])    DMT1 age 64 Dr. Tawni Fendt [facility])   Pneumonia    2005   Past Surgical History:  Procedure Laterality Date   CERVICAL POLYPECTOMY N/A 04/25/2016   Procedure: CERVICAL POLYPECTOMY;  Surgeon: Rosaline Luna, MD;  Location: [facility];  Service: Gynecology;  Laterality: N/A;   HYSTEROSCOPY WITH D & C N/A 04/25/2016   Procedure: DILATATION AND CURETTAGE /HYSTEROSCOPY;  Surgeon: Rosaline Luna, MD;  Location: [facility];  Service: Gynecology;  Laterality: N/A;   MULTIPLE TOOTH EXTRACTIONS     RETINAL LASER PROCEDURE     TYMPANOSTOMY TUBE PLACEMENT  age 58   FAMILY HISTORY Family History  Problem Relation Age of Onset   Diabetes Mother    Hypertension Mother    Hyperlipidemia Mother    Thyroid  disease Mother    Heart disease Mother    Diabetes Maternal Uncle    Hypertension Maternal Uncle    Hypertension Maternal Grandmother    Heart disease Maternal Grandmother    Diabetes Maternal Grandfather    Hypertension Maternal Grandfather    Hyperlipidemia Maternal Grandfather    SOCIAL HISTORY Social History   Tobacco Use   Smoking status: Former    Current packs/day: 0.00    Types: Cigarettes    Quit date: 07/14/2009    Years since quitting: 14.5   Smokeless tobacco: Never  Vaping Use   Vaping status: Never Used  Substance Use Topics   Alcohol use: Yes    Alcohol/week: 0.0 standard drinks of alcohol    Comment: occasionally   Drug use: No       OPHTHALMIC EXAM:  Base Eye Exam     Visual Acuity (Snellen - Linear)       Right Left   Dist cc 20/20 -1 20/25 -2   Dist ph cc  NI         Tonometry (Tonopen, 12:57 PM)       Right Left   Pressure 21 16          Pupils       Pupils Dark Light Shape React APD   Right PERRL 5 4 Round Brisk None   Left PERRL 5 4 Round Brisk None         Visual Fields       Left Right    Full Full         Extraocular Movement       Right Left  Full, Ortho Full, Ortho         Neuro/Psych     Oriented x3: Yes   Mood/Affect: Normal         Dilation     Both eyes: 2.5% Phenylephrine @ 12:57 PM           Slit Lamp and Fundus Exam     Slit Lamp Exam       Right Left   Lids/Lashes Normal Normal   Conjunctiva/Sclera White and quiet White and quiet   Cornea trace tear film debris Clear   Anterior Chamber deep and clear Deep and quiet   Iris Round and dilated, No NVI Round and dilated, No NVI   Lens Clear Punctate cortical changes   Anterior Vitreous mild syneresis, Posterior vitreous detachment, vitreous condensations Vitreous syneresis, Posterior vitreous detachment, vitreous condensations         Fundus Exam       Right Left   Disc Pink and Sharp, trace Peripapillary atrophy, no NVD Pink and Sharp, Compact   C/D Ratio 0.2 0.3   Macula Flat, Good foveal reflex, Retinal pigment epithelial mottling, No heme or edema Flat, Good foveal reflex, mild retinal pigment epithelial mottling, No heme or edema   Vessels mild attenuation, mild tortuosity, mild copper wiring attenuated, mild tortuosity, mild copper wiring   Periphery Attached, VR tuft at 0530 equator, superior lattice degeneration at 1100-0130 ora, peripheral lattice ST quadrant, paving stone  at 0400 -- good laser from 1100 to 0130 and 0700 equator, lattice with atrophic holes at 1000 -- good laser changes, No new RT/RD/lattice, No heme Attached, Lattice degeneration from 0500-0700, superior lattice from 1000 to 0200 ora - good laser surrounding all lesions, No new RT/RD/lattice, No heme           Refraction     Wearing Rx       Sphere Cylinder Axis   Right -10.25 +3.50 095   Left -9.00` +3.00 081            IMAGING AND PROCEDURES  Imaging and Procedures for @TODAY @  OCT, Retina - OU - Both Eyes       Right Eye Quality was good. Central Foveal Thickness: 300. Progression has been stable. Findings include normal foveal contour, no IRF, no SRF, vitreomacular adhesion .   Left Eye Quality was good. Central Foveal Thickness: 304. Progression has been stable. Findings include normal foveal contour, no IRF, no SRF.   Notes *Images captured and stored on drive  Diagnosis / Impression:  NFP, No IRF/SRF OU  Clinical management:  See below  Abbreviations: NFP - Normal foveal profile. CME - cystoid macular edema. PED - pigment epithelial detachment. IRF - intraretinal fluid. SRF - subretinal fluid. EZ - ellipsoid zone. ERM - epiretinal membrane. ORA - outer retinal atrophy. ORT - outer retinal tubulation. SRHM - subretinal hyper-reflective material             ASSESSMENT/PLAN:    ICD-10-CM   1. 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 Retinal holes, bilateral  H33.323     4. High myopia, bilateral  H52.13      1. Diabetes mellitus, type 1 without retinopathy  - last A1c was 7.3 on 05.29.25; 8.2 on 03.05.24, 7.4 on 05.04.23, 7.6 on 03.29.22  - The incidence, risk factors for  progression, natural history and treatment options for diabetic retinopathy were discussed with patient.    - The need for close monitoring of blood glucose, blood pressure, and serum lipids, avoiding cigarette or any type of tobacco, and the need for long term follow up was also discussed with patient.  - monitor  2,3. Lattice degeneration with atrophic retinal holes, OU -   - NI:dlezmpnm lattice at ora, peripheral lattice SN quadrant, VR tuft at 530 equator OD  - new lattice with atrophic holes at 1000 OD noted 01.15.21  - OS: superior lattice from 1000 to 0200,  lattice from 0500 to 0700 OS  - S/P laser retinopexy OD (10.04.19, 01.15.21) -- good laser surrounding lattice  - S/P laser retinopexy OS (11.01.19) -- good laser in place  - no new RT/RD or lattice  - pt is cleared from a retina standpoint for release to Dr. Octavia and resumption of primary eye care -- will send letter  4. High Myopia OU-   - discussed association of myopia with lattice degen and RT/RD  - doing well w/ current specs  - under the expert care of Dr. Octavia  Ophthalmic Meds Ordered this visit:  No orders of the defined types were placed in this encounter.    Return if symptoms worsen or fail to improve.  There are no Patient Instructions on file for this visit.  Explained the diagnoses, plan, and follow up with the patient and they expressed understanding.  Patient expressed understanding of the importance of proper follow up care.   This document serves as a record of services personally performed by Redell JUDITHANN Hans, MD, PhD. It was created on their behalf by Avelina Pereyra, COA an ophthalmic technician. The creation of this record is the provider's dictation and/or activities during the visit.   Electronically signed by: Avelina GORMAN Pereyra, COT  01/28/24  11:04 PM   This document serves as a record of services personally performed by Redell JUDITHANN Hans, MD, PhD. It was created on their behalf by Alan PARAS. Delores, OA an ophthalmic technician. The creation of this record is the provider's dictation and/or activities during the visit.    Electronically signed by: Alan PARAS. Delores, OA 01/28/24 11:04 PM  Redell JUDITHANN Hans, M.D., Ph.D. Diseases & Surgery of the Retina and Vitreous [facility]  I have reviewed the above documentation for accuracy and completeness, and I agree with the above. Redell JUDITHANN Hans, M.D., Ph.D. 01/28/24 11:05 PM    Abbreviations: M myopia (nearsighted); A astigmatism; H hyperopia (farsighted); P presbyopia; Mrx spectacle prescription;   CTL contact lenses; OD right eye; OS left eye; OU both eyes  XT exotropia; ET esotropia; PEK punctate epithelial keratitis; PEE punctate epithelial erosions; DES dry eye syndrome; MGD meibomian gland dysfunction; ATs artificial tears; PFAT's preservative free artificial tears; NSC nuclear sclerotic cataract; PSC posterior subcapsular cataract; ERM epi-retinal membrane; PVD posterior vitreous detachment; RD retinal detachment; DM diabetes mellitus; DR diabetic retinopathy; NPDR non-proliferative diabetic retinopathy; PDR proliferative diabetic retinopathy; CSME clinically significant macular edema; DME diabetic macular edema; dbh dot blot hemorrhages; CWS cotton wool spot; POAG primary open angle glaucoma; C/D cup-to-disc ratio; HVF humphrey visual field; GVF goldmann visual field; OCT optical coherence tomography; IOP intraocular pressure; BRVO Branch retinal vein occlusion; CRVO central retinal vein occlusion; CRAO central retinal artery occlusion; BRAO branch retinal artery occlusion; RT retinal tear; SB scleral buckle; PPV pars plana vitrectomy; VH Vitreous hemorrhage; PRP panretinal laser photocoagulation; IVK intravitreal kenalog; VMT  vitreomacular traction; MH Macular hole;  NVD neovascularization of the disc; NVE neovascularization elsewhere; AREDS age related eye disease study; ARMD age related macular degeneration; POAG primary open angle glaucoma; EBMD epithelial/anterior basement membrane dystrophy; ACIOL anterior chamber intraocular lens; IOL intraocular lens; PCIOL posterior chamber intraocular lens; Phaco/IOL phacoemulsification with intraocular lens placement; PRK photorefractive keratectomy; LASIK laser assisted in situ keratomileusis; HTN hypertension; DM diabetes mellitus; COPD chronic obstructive pulmonary disease

## 2024-01-25 ENCOUNTER — Ambulatory Visit (INDEPENDENT_AMBULATORY_CARE_PROVIDER_SITE_OTHER): Payer: BC Managed Care – PPO | Admitting: Ophthalmology

## 2024-01-25 ENCOUNTER — Encounter (INDEPENDENT_AMBULATORY_CARE_PROVIDER_SITE_OTHER): Payer: Self-pay | Admitting: Ophthalmology

## 2024-01-25 DIAGNOSIS — E109 Type 1 diabetes mellitus without complications: Secondary | ICD-10-CM | POA: Diagnosis not present

## 2024-01-25 DIAGNOSIS — H5213 Myopia, bilateral: Secondary | ICD-10-CM

## 2024-01-25 DIAGNOSIS — H33323 Round hole, bilateral: Secondary | ICD-10-CM

## 2024-01-25 DIAGNOSIS — H35413 Lattice degeneration of retina, bilateral: Secondary | ICD-10-CM

## 2024-01-28 ENCOUNTER — Encounter (INDEPENDENT_AMBULATORY_CARE_PROVIDER_SITE_OTHER): Payer: Self-pay | Admitting: Ophthalmology

## 2024-02-19 ENCOUNTER — Ambulatory Visit: Admitting: Internal Medicine

## 2024-02-19 ENCOUNTER — Encounter: Payer: Self-pay | Admitting: Internal Medicine

## 2024-02-19 VITALS — BP 120/70 | HR 106 | Ht 68.0 in | Wt 234.4 lb

## 2024-02-19 DIAGNOSIS — E1069 Type 1 diabetes mellitus with other specified complication: Secondary | ICD-10-CM

## 2024-02-19 DIAGNOSIS — E785 Hyperlipidemia, unspecified: Secondary | ICD-10-CM | POA: Diagnosis not present

## 2024-02-19 DIAGNOSIS — E10649 Type 1 diabetes mellitus with hypoglycemia without coma: Secondary | ICD-10-CM

## 2024-02-19 LAB — POCT GLYCOSYLATED HEMOGLOBIN (HGB A1C): Hemoglobin A1C: 6.7 % — AB (ref 4.0–5.6)

## 2024-02-19 MED ORDER — FIASP 100 UNIT/ML IJ SOLN: INTRAMUSCULAR | 3 refills | Status: AC
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
## 2024-02-19 NOTE — Patient Instructions (Addendum)
 Please use the following settings: - basal rates: 12 am: 2.0 4 am:  2.0 8 am: 1.8   11 am: 1.95 2 pm: 1.95 4 pm: 1.95 10 pm: 1.8 - ICR:  12 am: 5.0 4 am: 4.5 11 am: 4.5  4 pm: 4.0 8:30 pm: 3.7 10 pm: 4.5 - target: 110-110 - ISF: 40, except 50 between 12 am-5 pm - Insulin  on Board: 3h  Try to see if you can change the infusion sets to TruSteel.   Please return in 4 months.

## 2024-02-19 NOTE — Addendum Note (Signed)
 Addended by: CLEOTILDE ROLIN RAMAN on: 02/19/2024 04:27 PM   Modules accepted: Orders

## 2024-02-20 ENCOUNTER — Ambulatory Visit: Payer: Self-pay | Admitting: Internal Medicine

## 2024-02-20 LAB — MICROALBUMIN / CREATININE URINE RATIO
Creatinine, Urine: 217 mg/dL (ref 20–275)
Microalb Creat Ratio: 2 mg/g{creat} (ref <30)
Microalb, Ur: 0.4 mg/dL

## 2024-03-04 ENCOUNTER — Encounter: Payer: Self-pay | Admitting: Internal Medicine

## 2024-03-15 ENCOUNTER — Telehealth: Payer: Self-pay

## 2024-03-15 NOTE — Telephone Encounter (Signed)
 Angelette Ganus (You)  [facility] 53m ago submitted to [facility]

## 2024-03-18 ENCOUNTER — Other Ambulatory Visit: Payer: Self-pay | Admitting: Family Medicine

## 2024-03-18 NOTE — Telephone Encounter (Signed)
  Kristen Carrillo (You)  [facility] just now submitted to [facility] (Diabetes)

## 2024-05-04 ENCOUNTER — Other Ambulatory Visit: Payer: Self-pay | Admitting: Family Medicine

## 2024-06-10 ENCOUNTER — Encounter: Payer: Self-pay | Admitting: Internal Medicine

## 2024-06-14 ENCOUNTER — Telehealth: Payer: Self-pay

## 2024-06-14 NOTE — Telephone Encounter (Signed)
 Parachute ordered submitted.

## 2024-06-15 ENCOUNTER — Encounter: Payer: Self-pay | Admitting: Family Medicine

## 2024-06-15 ENCOUNTER — Ambulatory Visit: Admitting: Family Medicine

## 2024-06-15 VITALS — BP 110/76 | HR 110 | Temp 99.1°F | Ht 68.0 in | Wt 238.6 lb

## 2024-06-15 DIAGNOSIS — I10 Essential (primary) hypertension: Secondary | ICD-10-CM

## 2024-06-15 DIAGNOSIS — E785 Hyperlipidemia, unspecified: Secondary | ICD-10-CM | POA: Diagnosis not present

## 2024-06-15 DIAGNOSIS — E1069 Type 1 diabetes mellitus with other specified complication: Secondary | ICD-10-CM

## 2024-06-15 DIAGNOSIS — E669 Obesity, unspecified: Secondary | ICD-10-CM

## 2024-06-15 NOTE — Patient Instructions (Signed)
 Schedule your complete physical in 6 months We'll notify you of your lab results and make any changes if needed Try and make healthy food choices and get regular exercise Call with any questions or concerns Stay Safe!  Stay Healthy! Merry Christmas!!!

## 2024-06-15 NOTE — Progress Notes (Unsigned)
   Subjective:    Patient ID: Kristen Carrillo, female    DOB: 10-Mar-1986, 38 y.o.   MRN: [id number]  HPI HTN- chronic problem, on Losartan  25mg  daily w/ good control.  No CP, SOB, HA's (stress related), visual changes, edema.  Hyperlipidemia- chronic problem, on Lipitor 20mg  daily.  No abd pain, N/V.  Obesity- up 4 lbs since August.  BMI now 36.38.  no regular exercise.   Review of Systems For ROS see HPI     Objective:   Physical Exam Vitals reviewed.  Constitutional:      General: She is not in acute distress.    Appearance: Normal appearance. She is well-developed. She is obese. She is not ill-appearing.  HENT:     Head: Normocephalic and atraumatic.  Eyes:     Conjunctiva/sclera: Conjunctivae normal.     Pupils: Pupils are equal, round, and reactive to light.  Neck:     Thyroid : No thyromegaly.  Cardiovascular:     Rate and Rhythm: Normal rate and regular rhythm.     Pulses: Normal pulses.     Heart sounds: Normal heart sounds. No murmur heard. Pulmonary:     Effort: Pulmonary effort is normal. No respiratory distress.     Breath sounds: Normal breath sounds.  Abdominal:     General: There is no distension.     Palpations: Abdomen is soft.     Tenderness: There is no abdominal tenderness.  Musculoskeletal:     Cervical back: Normal range of motion and neck supple.     Right lower leg: No edema.     Left lower leg: No edema.  Lymphadenopathy:     Cervical: No cervical adenopathy.  Skin:    General: Skin is warm and dry.  Neurological:     General: No focal deficit present.     Mental Status: She is alert and oriented to person, place, and time.  Psychiatric:        Mood and Affect: Mood normal.        Behavior: Behavior normal.        Thought Content: Thought content normal.           Assessment & Plan:

## 2024-06-16 LAB — BASIC METABOLIC PANEL WITH GFR
BUN: 17 mg/dL (ref 6–23)
CO2: 24 meq/L (ref 19–32)
Calcium: 9.1 mg/dL (ref 8.4–10.5)
Chloride: 103 meq/L (ref 96–112)
Creatinine, Ser: 0.94 mg/dL (ref 0.40–1.20)
GFR: 77.64 mL/min (ref >60.00)
Glucose, Bld: 169 mg/dL — ABNORMAL HIGH (ref 70–99)
Potassium: 4.3 meq/L (ref 3.5–5.1)
Sodium: 138 meq/L (ref 135–145)

## 2024-06-16 LAB — HEPATIC FUNCTION PANEL
ALT: 18 U/L (ref 0–35)
AST: 17 U/L (ref 0–37)
Albumin: 3.7 g/dL (ref 3.5–5.2)
Alkaline Phosphatase: 75 U/L (ref 39–117)
Bilirubin, Direct: 0 mg/dL (ref 0.0–0.3)
Total Bilirubin: 0.4 mg/dL (ref 0.2–1.2)
Total Protein: 6.9 g/dL (ref 6.0–8.3)

## 2024-06-16 LAB — CBC WITH DIFFERENTIAL/PLATELET
Basophils Absolute: 0.1 K/uL (ref 0.0–0.1)
Basophils Relative: 0.5 % (ref 0.0–3.0)
Eosinophils Absolute: 0.3 K/uL (ref 0.0–0.7)
Eosinophils Relative: 2.5 % (ref 0.0–5.0)
HCT: 40.9 % (ref 36.0–46.0)
Hemoglobin: 13.9 g/dL (ref 12.0–15.0)
Lymphocytes Relative: 32.8 % (ref 12.0–46.0)
Lymphs Abs: 4.1 K/uL — ABNORMAL HIGH (ref 0.7–4.0)
MCHC: 33.9 g/dL (ref 30.0–36.0)
MCV: 82.3 fl (ref 78.0–100.0)
Monocytes Absolute: 0.7 K/uL (ref 0.1–1.0)
Monocytes Relative: 5.3 % (ref 3.0–12.0)
Neutro Abs: 7.3 K/uL (ref 1.4–7.7)
Neutrophils Relative %: 58.9 % (ref 43.0–77.0)
Platelets: 361 K/uL (ref 150.0–400.0)
RBC: 4.97 Mil/uL (ref 3.87–5.11)
RDW: 13.3 % (ref 11.5–15.5)
WBC: 12.4 K/uL — ABNORMAL HIGH (ref 4.0–10.5)

## 2024-06-16 LAB — LIPID PANEL
Cholesterol: 163 mg/dL (ref 0–200)
HDL: 66.4 mg/dL (ref >39.00)
LDL Cholesterol: 85 mg/dL (ref 0–99)
NonHDL: 96.77
Total CHOL/HDL Ratio: 2
Triglycerides: 58 mg/dL (ref 0.0–149.0)
VLDL: 11.6 mg/dL (ref 0.0–40.0)

## 2024-06-16 LAB — TSH: TSH: 2.21 u[IU]/mL (ref 0.35–5.50)

## 2024-06-16 NOTE — Assessment & Plan Note (Signed)
 Chronic problem.  On Losartan  25mg  daily w/ good control.  Currently asymptomatic w/ exception of stress headaches.  Check labs due to ARB use but no anticipated med changes.

## 2024-06-16 NOTE — Assessment & Plan Note (Signed)
Chronic problem.  On Lipitor 20mg daily w/o difficulty.  Check labs.  Adjust meds prn  

## 2024-06-16 NOTE — Assessment & Plan Note (Signed)
 Deteriorated.  Pt has gained 4 lbs since August.  BMI now 36.38 but with her DM, HTN, and hyperlipidemia this qualifies as morbidly obese.  She is not exercising regularly, encouraged her to make this part of her regular routine.  Will follow.

## 2024-06-19 ENCOUNTER — Ambulatory Visit: Payer: Self-pay | Admitting: Family Medicine

## 2024-06-19 DIAGNOSIS — D72829 Elevated white blood cell count, unspecified: Secondary | ICD-10-CM

## 2024-06-20 ENCOUNTER — Encounter: Payer: Self-pay | Admitting: Internal Medicine

## 2024-06-21 ENCOUNTER — Encounter: Payer: Self-pay | Admitting: Internal Medicine

## 2024-06-21 ENCOUNTER — Ambulatory Visit: Admitting: Internal Medicine

## 2024-06-21 VITALS — BP 112/60 | HR 76 | Ht 68.0 in | Wt 238.8 lb

## 2024-06-21 DIAGNOSIS — E1069 Type 1 diabetes mellitus with other specified complication: Secondary | ICD-10-CM

## 2024-06-21 DIAGNOSIS — E66812 Obesity, class 2: Secondary | ICD-10-CM

## 2024-06-21 DIAGNOSIS — E10649 Type 1 diabetes mellitus with hypoglycemia without coma: Secondary | ICD-10-CM

## 2024-06-21 DIAGNOSIS — E785 Hyperlipidemia, unspecified: Secondary | ICD-10-CM

## 2024-06-21 LAB — POCT GLYCOSYLATED HEMOGLOBIN (HGB A1C): Hemoglobin A1C: 7.1 % — AB (ref 4.0–5.6)
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
## 2024-06-21 NOTE — Patient Instructions (Addendum)
 Please use the following settings: - basal rates: 12 am: 2.0 4 am:  2.0 8 am: 1.8   11 am: 1.95 2 pm: 1.95 4 pm: 1.95 10 pm: 1.8 - ICR:  12 am: 5.0 4 am: 4.5 8 am: 5 11 am: 4.5  4 pm: 4.0 8:30 pm: 3.7 10 pm: 4.5 - target: 110-110 - ISF: 40, except 50 between 12 am-4 pm - Insulin  on Board: 3h  Try to see if you can attach the pump on upper arms, thighs, or chest.   Please return in 4 months.

## 2024-06-21 NOTE — Telephone Encounter (Unsigned)
 Copied from CRM #[id number]. Topic: Clinical - Lab/Test Results >> Jun 21, 2024  3:32 PM Alexandria E wrote: Reason for CRM: Patient returning phone call from office. Relayed lab results to patient and she did not have any further questions. Got patient scheduled for a lab only re-check as well.

## 2024-06-21 NOTE — Progress Notes (Signed)
 Called patient and [facility]. Please relay lab results and make lab only appt in 1-2 weeks

## 2024-06-27 ENCOUNTER — Encounter: Payer: Self-pay | Admitting: Family Medicine

## 2024-06-28 ENCOUNTER — Other Ambulatory Visit

## 2024-06-30 ENCOUNTER — Other Ambulatory Visit: Payer: Self-pay | Admitting: Family Medicine

## 2024-07-01 ENCOUNTER — Encounter: Payer: Self-pay | Admitting: Family Medicine

## 2024-07-01 ENCOUNTER — Telehealth: Payer: Self-pay

## 2024-07-01 ENCOUNTER — Other Ambulatory Visit

## 2024-07-01 DIAGNOSIS — D72829 Elevated white blood cell count, unspecified: Secondary | ICD-10-CM

## 2024-07-01 NOTE — Telephone Encounter (Unsigned)
 Copied from CRM #[id number]. Topic: Appointments - Appointment Info/Confirmation >> Jul 01, 2024  3:15 PM Shereese L wrote: Patient/patient representative is calling for information regarding an appointment.  Patient called to say that she's on her way

## 2024-07-01 NOTE — Addendum Note (Signed)
 Addended by: TANDA HARVEY D on: 07/01/2024 03:24 PM   Modules accepted: Orders

## 2024-07-01 NOTE — Telephone Encounter (Signed)
Patient came in for labs. 

## 2024-07-02 LAB — CBC WITH DIFFERENTIAL/PLATELET
Absolute Lymphocytes: 4975 {cells}/uL — ABNORMAL HIGH (ref 850–3900)
Absolute Monocytes: 738 {cells}/uL (ref 200–950)
Basophils Absolute: 88 {cells}/uL (ref 0–200)
Basophils Relative: 0.7 %
Eosinophils Absolute: 350 {cells}/uL (ref 15–500)
Eosinophils Relative: 2.8 %
HCT: 40.7 % (ref 35.9–46.0)
Hemoglobin: 13.4 g/dL (ref 11.7–15.5)
MCH: 27.4 pg (ref 27.0–33.0)
MCHC: 32.9 g/dL (ref 31.6–35.4)
MCV: 83.2 fL (ref 81.4–101.7)
MPV: 9.4 fL (ref 7.5–12.5)
Monocytes Relative: 5.9 %
Neutro Abs: 6350 {cells}/uL (ref 1500–7800)
Neutrophils Relative %: 50.8 %
Platelets: 390 Thousand/uL (ref 140–400)
RBC: 4.89 Million/uL (ref 3.80–5.10)
RDW: 12.8 % (ref 11.0–15.0)
Total Lymphocyte: 39.8 %
WBC: 12.5 Thousand/uL — ABNORMAL HIGH (ref 3.8–10.8)

## 2024-07-04 ENCOUNTER — Ambulatory Visit: Payer: Self-pay | Admitting: Family Medicine

## 2024-10-18 ENCOUNTER — Ambulatory Visit: Admitting: Internal Medicine

## 2024-12-19 ENCOUNTER — Encounter: Admitting: Family Medicine
